# Patient Record
Sex: Female | Born: 1980 | Race: Black or African American | Hispanic: No | Marital: Single | Smoking: Former smoker
Health system: Southern US, Community
[De-identification: ages and names within clinical notes are randomized; demographics above are authoritative.]

## PROBLEM LIST (undated history)

## (undated) DIAGNOSIS — K219 Gastro-esophageal reflux disease without esophagitis: Secondary | ICD-10-CM

## (undated) DIAGNOSIS — I10 Essential (primary) hypertension: Secondary | ICD-10-CM

## (undated) HISTORY — PX: TUBAL LIGATION: SHX77

---

## 2014-06-20 ENCOUNTER — Emergency Department: Payer: Self-pay | Admitting: Emergency Medicine

## 2014-06-20 LAB — BASIC METABOLIC PANEL
ANION GAP: 6 — AB (ref 7–16)
BUN: 17 mg/dL
CALCIUM: 9.7 mg/dL
CHLORIDE: 103 mmol/L
CREATININE: 0.97 mg/dL
Co2: 29 mmol/L
EGFR (African American): >60
GLUCOSE: 117 mg/dL — AB
POTASSIUM: 3.8 mmol/L
Sodium: 138 mmol/L

## 2014-06-20 LAB — CBC
HCT: 45.6 % (ref 35.0–47.0)
HGB: 15 g/dL (ref 12.0–16.0)
MCH: 27.8 pg (ref 26.0–34.0)
MCHC: 32.9 g/dL (ref 32.0–36.0)
MCV: 85 fL (ref 80–100)
PLATELETS: 174 10*3/uL (ref 150–440)
RBC: 5.39 10*6/uL — ABNORMAL HIGH (ref 3.80–5.20)
RDW: 13 % (ref 11.5–14.5)
WBC: 5 10*3/uL (ref 3.6–11.0)

## 2014-06-20 LAB — TROPONIN I

## 2014-09-06 ENCOUNTER — Encounter: Payer: Self-pay | Admitting: Emergency Medicine

## 2014-09-06 ENCOUNTER — Emergency Department
Admission: EM | Admit: 2014-09-06 | Discharge: 2014-09-06 | Disposition: A | Discharge status: Home / Self Care | Payer: Self-pay | Attending: Emergency Medicine | Admitting: Emergency Medicine

## 2014-09-06 DIAGNOSIS — I10 Essential (primary) hypertension: Secondary | ICD-10-CM | POA: Insufficient documentation

## 2014-09-06 DIAGNOSIS — Z720 Tobacco use: Secondary | ICD-10-CM | POA: Insufficient documentation

## 2014-09-06 DIAGNOSIS — H109 Unspecified conjunctivitis: Secondary | ICD-10-CM | POA: Insufficient documentation

## 2014-09-06 HISTORY — DX: Essential (primary) hypertension: I10

## 2014-09-06 MED ORDER — CIPROFLOXACIN HCL 0.3 % OP SOLN: 2.0000 [drp] | OPHTHALMIC | Status: DC

## 2014-09-06 MED ORDER — KETOROLAC TROMETHAMINE 10 MG PO TABS: 10.0000 mg | ORAL_TABLET | Freq: Once | ORAL | Status: DC

## 2014-09-06 MED ORDER — ACETAMINOPHEN 325 MG PO TABS
ORAL_TABLET | ORAL | Status: AC
  Filled 2014-09-06: qty 2

## 2014-09-06 MED ORDER — IBUPROFEN 800 MG PO TABS: 800.0000 mg | ORAL_TABLET | Freq: Three times a day (TID) | ORAL | Status: DC | PRN

## 2014-09-06 MED ORDER — KETOROLAC TROMETHAMINE 10 MG PO TABS
ORAL_TABLET | ORAL | Status: AC
  Filled 2014-09-06: qty 1

## 2014-09-06 MED ORDER — HYDROCHLOROTHIAZIDE 12.5 MG PO CAPS
12.5000 mg | ORAL_CAPSULE | Freq: Every day | ORAL | Status: DC
  Administered 2014-09-06: 12.5 mg via ORAL

## 2014-09-06 MED ORDER — CIPROFLOXACIN HCL 0.3 % OP SOLN
OPHTHALMIC | Status: AC
  Administered 2014-09-06: 2 [drp] via OPHTHALMIC
  Filled 2014-09-06: qty 2.5

## 2014-09-06 MED ORDER — HYDROCHLOROTHIAZIDE 12.5 MG PO CAPS
ORAL_CAPSULE | ORAL | Status: AC
  Administered 2014-09-06: 12.5 mg via ORAL
  Filled 2014-09-06: qty 1

## 2014-09-06 MED ORDER — ACETAMINOPHEN 325 MG PO TABS: 650.0000 mg | ORAL_TABLET | Freq: Once | ORAL | Status: DC

## 2014-09-06 MED ORDER — CIPROFLOXACIN HCL 0.3 % OP SOLN
2.0000 [drp] | Freq: Once | OPHTHALMIC | Status: AC
  Administered 2014-09-06: 2 [drp] via OPHTHALMIC

## 2014-09-06 MED ORDER — HYDROCHLOROTHIAZIDE 12.5 MG PO CAPS: 12.5000 mg | ORAL_CAPSULE | Freq: Every day | ORAL | Status: DC

## 2014-09-06 NOTE — ED Notes (Addendum)
Pt reports a "vail" over eyes. Reports for the past month green discharge has been leaking from eyes. Pt verbalized eyes have felt irritated. Pt wearing contacts at this time and reports no new changes in contacts in the past month. Py verbalized headache at this time, states, "I always get a headache when my blood pressure is high"

## 2014-09-06 NOTE — ED Provider Notes (Signed)
[facility] Emergency Department Provider Note  ____________________________________________  Time seen: 1645  I have reviewed the triage vital signs and the nursing notes.   HISTORY  Chief Complaint Eye Pain    HPI Shirley Stewart is a 34 y.o. female complaining ofgreen discharge coming from her eyes states that off and on over the last month she feels like she has had trouble seeing reports that she has disposable contacts and has not changed them due to the fact that she ran out of the prescription she also states that her blood pressures elevated and has a headache also because she ran out of prescription for blood pressure medication states when she takes her contacts out the blurred vision goes away no other complaints at this time rates her headache as about a 6 out of 10 nothing seemingly making it better or worse and is here today for further evaluation and treatment   Past Medical History  Diagnosis Date  . Hypertension     There are no active problems to display for this patient.   History reviewed. No pertinent past surgical history.  Current Outpatient Rx  Name  Route  Sig  Dispense  Refill  . ciprofloxacin (CILOXAN) 0.3 % ophthalmic solution   Both Eyes   Place 2 drops into both eyes every 4 (four) hours while awake. Administer 1 drop, every 2 hours, while awake, for 2 days. Then 1 drop, every 4 hours, while awake, for the next 5 days.   5 mL   0   . hydrochlorothiazide (MICROZIDE) 12.5 MG capsule   Oral   Take 1 capsule (12.5 mg total) by mouth daily.   30 capsule   0   . ibuprofen (ADVIL,MOTRIN) 800 MG tablet   Oral   Take 1 tablet (800 mg total) by mouth every 8 (eight) hours as needed.   30 tablet   0     Allergies Review of patient's allergies indicates no known allergies.  History reviewed. No pertinent family history.  Social History History  Substance Use Topics  . Smoking status: Current Every Day Smoker  .  Smokeless tobacco: Not on file  . Alcohol Use: No    Review of Systems Constitutional: No fever/chills Eyes: No visual changes. ENT: No sore throat. Cardiovascular: Denies chest pain. Respiratory: Denies shortness of breath. Gastrointestinal: No abdominal pain.  No nausea, no vomiting.  No diarrhea.  No constipation. Genitourinary: Negative for dysuria. Musculoskeletal: Negative for back pain. Skin: Negative for rash. Neurological: Negative for headaches, focal weakness or numbness.  10-point ROS otherwise negative.  ____________________________________________   PHYSICAL EXAM:  VITAL SIGNS: ED Triage Vitals  Enc Vitals Group     BP 09/06/14 1605 151/100 mmHg     Pulse Rate 09/06/14 1605 85     Resp 09/06/14 1605 18     Temp 09/06/14 1605 98.1 F (36.7 C)     Temp Source 09/06/14 1605 Oral     SpO2 09/06/14 1605 100 %     Weight 09/06/14 1605 130 lb (58.968 kg)     Height 09/06/14 1605 5' (1.524 m)     Head Cir --      Peak Flow --      Pain Score 09/06/14 1606 8     Pain Loc --      Pain Edu? --      Excl. in GC? --     Constitutional: Alert and oriented. Well appearing and in no acute distress. Eyes: Red  and injected conjunctiva. PERRL. EOMI. Head: Atraumatic. Nose: No congestion/rhinnorhea. Mouth/Throat: Mucous membranes are moist.  Oropharynx non-erythematous. Neck: No stridor.   Cardiovascular: Normal rate, regular rhythm. Grossly normal heart sounds.  Good peripheral circulation. Respiratory: Normal respiratory effort.  No retractions. Lungs CTAB. Gastrointestinal: Soft and nontender. No distention. No abdominal bruits. No CVA tenderness. Musculoskeletal: No lower extremity tenderness nor edema.  No joint effusions. Neurologic:  Normal speech and language. No gross focal neurologic deficits are appreciated. Speech is normal. No gait instability. Skin:  Skin is warm, dry and intact. No rash noted. Psychiatric: Mood and affect are normal. Speech and behavior  are normal.  ____________________________________________  PROCEDURES  Procedure(s) performed: None  Critical Care performed: No  ____________________________________________   INITIAL IMPRESSION / ASSESSMENT AND PLAN / ED COURSE  Pertinent labs & imaging results that were available during my care of the patient were reviewed by me and considered in my medical decision making (see chart for details).  Initial impression on this patient conjunctivitis patient also had an elevated blood pressure has a history of same has been out of her hydrochlorothiazide for about a week when ahead and gave her a dose here and a been a refill that given that she is a contact wearer started her on Cipro drops advised her to throw out her old contacts that she cannot recycle them ____________________________________________   FINAL CLINICAL IMPRESSION(S) / ED DIAGNOSES  Final diagnoses:  Bilateral conjunctivitis     Paulett Kaufhold Rosalyn Gess, PA-C 09/06/14 1754  Loleta Rose, MD 09/07/14 [phone]

## 2014-09-06 NOTE — ED Notes (Signed)
Pt to ed with c/o bilat eye pain and drainage x 1 month, reports pain when in sunlight.

## 2014-09-06 NOTE — Discharge Instructions (Signed)
Bacterial Conjunctivitis Bacterial conjunctivitis (commonly called pink eye) is redness, soreness, or puffiness (inflammation) of the white part of your eye. It is caused by a germ called bacteria. These germs can easily spread from person to person (contagious). Your eye often will become red or pink. Your eye may also become irritated, watery, or have a thick discharge.  HOME CARE   Apply a cool, clean washcloth over closed eyelids. Do this for 10-20 minutes, 3-4 times a day while you have pain.  Gently wipe away any fluid coming from the eye with a warm, wet washcloth or cotton ball.  Wash your hands often with soap and water. Use paper towels to dry your hands.  Do not share towels or washcloths.  Change or wash your pillowcase every day.  Do not use eye makeup until the infection is gone.  Do not use machines or drive if your vision is blurry.  Stop using contact lenses. Do not use them again until your doctor says it is okay.  Do not touch the tip of the eye drop bottle or medicine tube with your fingers when you put medicine on the eye. GET HELP RIGHT AWAY IF:   Your eye is not better after 3 days of starting your medicine.  You have a yellowish fluid coming out of the eye.  You have more pain in the eye.  Your eye redness is spreading.  Your vision becomes blurry.  You have a fever or lasting symptoms for more than 2-3 days.  You have a fever and your symptoms suddenly get worse.  You have pain in the face.  Your face gets red or puffy (swollen). MAKE SURE YOU:   Understand these instructions.  Will watch this condition.  Will get help right away if you are not doing well or get worse. Document Released: 12/19/2007 Document Revised: 02/26/2012 Document Reviewed: 11/15/2011 [facility] Patient Information 2015 [place], [place]. This information is not intended to replace advice given to you by your health care provider. Make sure you discuss any questions you have  with your health care provider.  Eye Drops Use eye drops as directed. It may be easier to have someone help you put the drops in your eye. If you are alone, use the following instructions to help you.  Wash your hands before putting drops in your eyes.  Read the label and look at your medication. Check for any expiration date that may appear on the bottle or tube. Changes of color may be a warning that the medication is old or ineffective. This is especially true if the medication has become brown in color. If you have questions or concerns, call your caregiver. DROPS  Tilt your head back with the affected eye uppermost. Gently pull down on your lower lid. Do not pull up on the upper lid.  Look up. Place the dropper or bottle just over the edge of the lower lid near the white portion at the bottom of the eye. The goal is to have the drop go into the little sac formed by the lower lid and the bottom of the eye itself. Do not release the drop from a height of several inches over the eye. That will only serve to startle the person receiving the medicine when it lands and forces a blink.  Steady your hand in a comfortable manner. An example would be to hold the dropper or bottle between your thumb and index (pointing) finger. Lean your index finger against the brow.  Then, slowly and gently squeeze one drop of medication into your eye. °· Once the medication has been applied, place your finger between the lower eyelid and the nose, pressing firmly against the nose for 5-10 seconds. This will slow the process of the eye drop entering the small canal that normally drains tears into the nose, and therefore increases the exposure of the medicine to the eye for a few extra seconds. °OINTMENTS °· Look up. Place the tip of the tube just over the edge of the lower lid near the white portion at the bottom of the eye. The goal is to create a line of ointment along the inner surface of the eyelid in the little sac  formed by the lower lid and the bottom of the eye itself. °· Avoid touching the tube tip to your eyeball or eyelid. This avoids contamination of the tube or the medicine in the tube. °· Once a line of medicine has been created, hold the upper lid up and look down before releasing the upper lid. This will force the ointment to spread over the surface of the eye. °· Your vision will be very blurry for a few minutes after applying an ointment properly. This is normal and will clear as you continue to blink. For this reason, it is best to apply ointments just before going to sleep, or at a time when you can rest your eyes for 5-10 minutes after applying the medication. °GENERAL °· Store your medicine in a cool, dry place after each use. °· If you need a second medication, wait at least two minutes. This helps the first medication to be taken up (absorbed) by the eye. °· If you have been instructed to use both an eye drop and an eye ointment, always apply the drop first and then the ointment 3-4 minutes afterward. °Never put medications into the eye unless the label reads, "For Ophthalmic Use," "For Use In Eyes" or "Eye Drops." If you have questions, call your caregiver. °Document Released: 06/17/2000 Document Revised: 07/26/2013 Document Reviewed: 08/23/2008 °[employer]® Patient Information ©2015 [employer]. This information is not intended to replace advice given to you by your health care provider. Make sure you discuss any questions you have with your health care provider. ° °

## 2014-10-25 ENCOUNTER — Other Ambulatory Visit: Payer: Self-pay

## 2014-10-25 ENCOUNTER — Encounter: Payer: Self-pay | Admitting: Emergency Medicine

## 2014-10-25 DIAGNOSIS — I10 Essential (primary) hypertension: Secondary | ICD-10-CM | POA: Insufficient documentation

## 2014-10-25 DIAGNOSIS — Z720 Tobacco use: Secondary | ICD-10-CM | POA: Insufficient documentation

## 2014-10-25 DIAGNOSIS — Z79899 Other long term (current) drug therapy: Secondary | ICD-10-CM | POA: Insufficient documentation

## 2014-10-25 DIAGNOSIS — K219 Gastro-esophageal reflux disease without esophagitis: Secondary | ICD-10-CM | POA: Insufficient documentation

## 2014-10-25 NOTE — ED Notes (Signed)
Pt presents to ED with dizziness and feeling like her blood pressure is elevated. Pt states just prior to arrival she took a sip and water and it felt like something was blocking her throat and she started to feel like she was choking. Pt states her ears were "clogging up" as well. Currently feels like her heart is racing and reports nausea and epigastric pain.

## 2014-10-26 ENCOUNTER — Emergency Department
Admission: EM | Admit: 2014-10-26 | Discharge: 2014-10-26 | Disposition: A | Discharge status: Home / Self Care | Payer: Self-pay | Attending: Emergency Medicine | Admitting: Emergency Medicine

## 2014-10-26 DIAGNOSIS — I10 Essential (primary) hypertension: Secondary | ICD-10-CM

## 2014-10-26 DIAGNOSIS — K219 Gastro-esophageal reflux disease without esophagitis: Secondary | ICD-10-CM

## 2014-10-26 HISTORY — DX: Gastro-esophageal reflux disease without esophagitis: K21.9

## 2014-10-26 LAB — BASIC METABOLIC PANEL
ANION GAP: 7 (ref 5–15)
CALCIUM: 9.2 mg/dL (ref 8.9–10.3)
CHLORIDE: 110 mmol/L (ref 101–111)
CO2: 27 mmol/L (ref 22–32)
Creatinine, Ser: 0.82 mg/dL (ref 0.44–1.00)
GFR calc Af Amer: >60 mL/min (ref >60)
GFR calc non Af Amer: >60 mL/min (ref >60)
GLUCOSE: 94 mg/dL (ref 65–99)
Potassium: 3.3 mmol/L — ABNORMAL LOW (ref 3.5–5.1)
Sodium: 144 mmol/L (ref 135–145)

## 2014-10-26 LAB — CBC
HCT: 37.8 % (ref 35.0–47.0)
Hemoglobin: 12.6 g/dL (ref 12.0–16.0)
MCH: 28 pg (ref 26.0–34.0)
MCHC: 33.3 g/dL (ref 32.0–36.0)
MCV: 84 fL (ref 80.0–100.0)
Platelets: 154 10*3/uL (ref 150–440)
RBC: 4.5 MIL/uL (ref 3.80–5.20)
RDW: 13.4 % (ref 11.5–14.5)
WBC: 6.7 10*3/uL (ref 3.6–11.0)

## 2014-10-26 LAB — TROPONIN I: Troponin I: <0.03 ng/mL (ref <0.031)

## 2014-10-26 MED ORDER — HYDROCHLOROTHIAZIDE 25 MG PO TABS
ORAL_TABLET | ORAL | Status: AC
Start: 2014-10-26 — End: 2014-10-26
  Administered 2014-10-26: 25 mg via ORAL
  Filled 2014-10-26: qty 1

## 2014-10-26 MED ORDER — PANTOPRAZOLE SODIUM 40 MG PO TBEC: 40.0000 mg | DELAYED_RELEASE_TABLET | Freq: Every day | ORAL | Status: DC

## 2014-10-26 MED ORDER — GI COCKTAIL ~~LOC~~: 30.0000 mL | Freq: Once | ORAL | Status: DC

## 2014-10-26 MED ORDER — HYDROCHLOROTHIAZIDE 25 MG PO TABS
25.0000 mg | ORAL_TABLET | Freq: Every day | ORAL | Status: DC
  Administered 2014-10-26: 25 mg via ORAL

## 2014-10-26 MED ORDER — PANTOPRAZOLE SODIUM 40 MG PO TBEC
40.0000 mg | DELAYED_RELEASE_TABLET | Freq: Once | ORAL | Status: AC
  Administered 2014-10-26: 40 mg via ORAL
  Filled 2014-10-26: qty 1

## 2014-10-26 MED ORDER — HYDROCHLOROTHIAZIDE 25 MG PO TABS: 25.0000 mg | ORAL_TABLET | Freq: Every day | ORAL | Status: DC

## 2014-10-26 NOTE — Discharge Instructions (Signed)

## 2014-10-26 NOTE — ED Provider Notes (Signed)
[facility] Emergency Department Provider Note  ____________________________________________  Time seen: 1:00 AM  I have reviewed the triage vital signs and the nursing notes.   HISTORY  Chief Complaint Hypertension and Dizziness      HPI Shirley Stewart is a 34 y.o. female presents with this feeling as though her blood pressures elevated. Patient states she attempted to take a sip of water after eating mashed potatoes and felt as though she was choking patient also states is that she feels like her heart was racing. Patient also admits to epigastric abdominal pain. Patient states that she was prescribed HCTZ for many years but had an episode where her blood pressure went low resulting in her becoming dizzy and a such a primary care doctor discontinued it.     Past Medical History  Diagnosis Date  . Hypertension   . GERD (gastroesophageal reflux disease)     There are no active problems to display for this patient.   Past Surgical History  Procedure Laterality Date  . Tubal ligation      Current Outpatient Rx  Name  Route  Sig  Dispense  Refill  . ciprofloxacin (CILOXAN) 0.3 % ophthalmic solution   Both Eyes   Place 2 drops into both eyes every 4 (four) hours while awake. Administer 1 drop, every 2 hours, while awake, for 2 days. Then 1 drop, every 4 hours, while awake, for the next 5 days.   5 mL   0   . hydrochlorothiazide (HYDRODIURIL) 25 MG tablet   Oral   Take 1 tablet (25 mg total) by mouth daily.   30 tablet   0   . hydrochlorothiazide (MICROZIDE) 12.5 MG capsule   Oral   Take 1 capsule (12.5 mg total) by mouth daily.   30 capsule   0   . ibuprofen (ADVIL,MOTRIN) 800 MG tablet   Oral   Take 1 tablet (800 mg total) by mouth every 8 (eight) hours as needed.   30 tablet   0   . pantoprazole (PROTONIX) 40 MG tablet   Oral   Take 1 tablet (40 mg total) by mouth daily.   30 tablet   0     Allergies Review of patient's  allergies indicates no known allergies.  No family history on file.  Social History History  Substance Use Topics  . Smoking status: Current Every Day Smoker -- 1.00 packs/day    Types: Cigarettes  . Smokeless tobacco: Not on file  . Alcohol Use: No    Review of Systems  Constitutional: Negative for fever. Eyes: Negative for visual changes. ENT: Negative for sore throat. Cardiovascular: Negative for chest pain. Respiratory: Negative for shortness of breath. Gastrointestinal: Negative for abdominal pain, vomiting and diarrhea. Genitourinary: Negative for dysuria. Musculoskeletal: Negative for back pain. Skin: Negative for rash. Neurological: Negative for headaches, focal weakness or numbness.   10-point ROS otherwise negative.  ____________________________________________   PHYSICAL EXAM:  VITAL SIGNS: ED Triage Vitals  Enc Vitals Group     BP 10/25/14 2337 160/108 mmHg     Pulse Rate 10/25/14 2337 97     Resp 10/25/14 2337 18     Temp 10/25/14 2337 98.6 F (37 C)     Temp Source 10/25/14 2337 Oral     SpO2 10/25/14 2337 100 %     Weight 10/25/14 2337 120 lb (54.432 kg)     Height 10/25/14 2337 5' (1.524 m)     Head Cir --  Peak Flow --      Pain Score 10/25/14 2338 9     Pain Loc --      Pain Edu? --      Excl. in GC? --      Constitutional: Alert and oriented. Well appearing and in no distress. Eyes: Conjunctivae are normal. PERRL. Normal extraocular movements. ENT   Head: Normocephalic and atraumatic.   Nose: No congestion/rhinnorhea.   Mouth/Throat: Mucous membranes are moist.   Neck: No stridor. Hematological/Lymphatic/Immunilogical: No cervical lymphadenopathy. Cardiovascular: Normal rate, regular rhythm. Normal and symmetric distal pulses are present in all extremities. No murmurs, rubs, or gallops. Respiratory: Normal respiratory effort without tachypnea nor retractions. Breath sounds are clear and equal bilaterally. No  wheezes/rales/rhonchi. Gastrointestinal: Soft and nontender. No distention. There is no CVA tenderness. Genitourinary: deferred Musculoskeletal: Nontender with normal range of motion in all extremities. No joint effusions.  No lower extremity tenderness nor edema. Neurologic:  Normal speech and language. No gross focal neurologic deficits are appreciated. Speech is normal.  Skin:  Skin is warm, dry and intact. No rash noted. Psychiatric: Mood and affect are normal. Speech and behavior are normal. Patient exhibits appropriate insight and judgment.  ____________________________________________    LABS (pertinent positives/negatives)  Labs Reviewed  BASIC METABOLIC PANEL - Abnormal; Notable for the following:    Potassium 3.3 (*)    BUN <5 (*)    All other components within normal limits  CBC  TROPONIN I  URINALYSIS COMPLETEWITH MICROSCOPIC ([facility] ONLY)  CBG MONITORING, ED     ____________________________________________   EKG  ED ECG REPORT I, BROWN,  N, the attending physician, personally viewed and interpreted this ECG.   Date: 10/26/2014  EKG Time:   Rate:   Rhythm:   Axis:   Intervals:  ST&T Change:    ____________________________________________    RADIOLOGY    INITIAL IMPRESSION / ASSESSMENT AND PLAN / ED COURSE  Pertinent labs & imaging results that were available during my care of the patient were reviewed by me and considered in my medical decision making (see chart for details).    ____________________________________________   FINAL CLINICAL IMPRESSION(S) / ED DIAGNOSES  Final diagnoses:  Essential hypertension  Gastroesophageal reflux disease without esophagitis      Darci Current, MD 10/27/14 [phone]

## 2015-01-26 ENCOUNTER — Emergency Department
Admission: EM | Admit: 2015-01-26 | Discharge: 2015-01-26 | Disposition: A | Discharge status: Home / Self Care | Payer: Self-pay | Attending: Emergency Medicine | Admitting: Emergency Medicine

## 2015-01-26 ENCOUNTER — Encounter: Payer: Self-pay | Admitting: *Deleted

## 2015-01-26 DIAGNOSIS — Z720 Tobacco use: Secondary | ICD-10-CM | POA: Insufficient documentation

## 2015-01-26 DIAGNOSIS — J0100 Acute maxillary sinusitis, unspecified: Secondary | ICD-10-CM | POA: Insufficient documentation

## 2015-01-26 DIAGNOSIS — I10 Essential (primary) hypertension: Secondary | ICD-10-CM | POA: Insufficient documentation

## 2015-01-26 DIAGNOSIS — J029 Acute pharyngitis, unspecified: Secondary | ICD-10-CM | POA: Insufficient documentation

## 2015-01-26 DIAGNOSIS — Z79899 Other long term (current) drug therapy: Secondary | ICD-10-CM | POA: Insufficient documentation

## 2015-01-26 MED ORDER — FLUTICASONE PROPIONATE 50 MCG/ACT NA SUSP: 1.0000 | Freq: Two times a day (BID) | NASAL | Status: DC

## 2015-01-26 MED ORDER — CETIRIZINE HCL 10 MG PO TABS
10.0000 mg | ORAL_TABLET | Freq: Every day | ORAL | Status: DC
Start: 2015-01-26 — End: 2016-10-02

## 2015-01-26 MED ORDER — AMOXICILLIN-POT CLAVULANATE 875-125 MG PO TABS: 1.0000 | ORAL_TABLET | Freq: Two times a day (BID) | ORAL | Status: DC

## 2015-01-26 MED ORDER — MAGIC MOUTHWASH W/LIDOCAINE: 5.0000 mL | Freq: Four times a day (QID) | ORAL | Status: DC

## 2015-01-26 NOTE — ED Provider Notes (Signed)
[facility] Emergency Department Provider Note  ____________________________________________  Time seen: Approximately 10:22 AM  I have reviewed the triage vital signs and the nursing notes.   HISTORY  Chief Complaint Facial Pain    HPI Shirley Stewart is a 34 y.o. female who presents to the emergency department complaining of sinus pressure, congestion, sore throat, and cough. She states that symptoms began rapidly 3 days ago and have progressed since then. She has taken "a few" over-the-counter medicationswith minimal relief. She states the pain is best described as pressure. It's mostly located around her nose and cheek bones.   Past Medical History  Diagnosis Date  . Hypertension   . GERD (gastroesophageal reflux disease)     There are no active problems to display for this patient.   Past Surgical History  Procedure Laterality Date  . Tubal ligation      Current Outpatient Rx  Name  Route  Sig  Dispense  Refill  . amoxicillin-clavulanate (AUGMENTIN) 875-125 MG tablet   Oral   Take 1 tablet by mouth 2 (two) times daily.   14 tablet   0   . cetirizine (ZYRTEC) 10 MG tablet   Oral   Take 1 tablet (10 mg total) by mouth daily.   30 tablet   0   . ciprofloxacin (CILOXAN) 0.3 % ophthalmic solution   Both Eyes   Place 2 drops into both eyes every 4 (four) hours while awake. Administer 1 drop, every 2 hours, while awake, for 2 days. Then 1 drop, every 4 hours, while awake, for the next 5 days.   5 mL   0   . fluticasone (FLONASE) 50 MCG/ACT nasal spray   Each Nare   Place 1 spray into both nostrils 2 (two) times daily.   16 g   0   . hydrochlorothiazide (HYDRODIURIL) 25 MG tablet   Oral   Take 1 tablet (25 mg total) by mouth daily.   30 tablet   0   . hydrochlorothiazide (MICROZIDE) 12.5 MG capsule   Oral   Take 1 capsule (12.5 mg total) by mouth daily.   30 capsule   0   . ibuprofen (ADVIL,MOTRIN) 800 MG tablet   Oral  Take 1 tablet (800 mg total) by mouth every 8 (eight) hours as needed.   30 tablet   0   . magic mouthwash w/lidocaine SOLN   Oral   Take 5 mLs by mouth 4 (four) times daily.   240 mL   0     Dispense in a 1/1/1/1 ratio   . pantoprazole (PROTONIX) 40 MG tablet   Oral   Take 1 tablet (40 mg total) by mouth daily.   30 tablet   0     Allergies Review of patient's allergies indicates no known allergies.  No family history on file.  Social History Social History  Substance Use Topics  . Smoking status: Current Every Day Smoker -- 1.00 packs/day    Types: Cigarettes  . Smokeless tobacco: None  . Alcohol Use: No    Review of Systems Constitutional: No fever/chills Eyes: No visual changes. ENT: Endorses nasal congestion. Endorses sinus pressure. Endorses sore throat.. Cardiovascular: Denies chest pain.  Respiratory: Denies shortness of breath. Endorses cough. Gastrointestinal: No abdominal pain.  No nausea, no vomiting.  No diarrhea.  No constipation. Genitourinary: Negative for dysuria. Musculoskeletal: Negative for back pain. Skin: Negative for rash. Neurological: Negative for headaches, focal weakness or numbness.  10-point ROS otherwise negative.  ____________________________________________   PHYSICAL EXAM:  VITAL SIGNS: ED Triage Vitals  Enc Vitals Group     BP 01/26/15 1008 145/109 mmHg     Pulse Rate 01/26/15 1008 80     Resp 01/26/15 1008 20     Temp 01/26/15 1008 98.2 F (36.8 C)     Temp Source 01/26/15 1008 Oral     SpO2 01/26/15 1008 100 %     Weight 01/26/15 1008 127 lb (57.607 kg)     Height 01/26/15 1008 5' (1.524 m)     Head Cir --      Peak Flow --      Pain Score 01/26/15 1009 8     Pain Loc --      Pain Edu? --      Excl. in GC? --     Constitutional: Alert and oriented. Well appearing and in no acute distress. Eyes: Conjunctivae are normal. PERRL. EOMI. Head: Atraumatic. Nose: Moderate purulent congestion/rhinnorhea. Turbinates  are erythematous and edematous. Maxillary sinuses are tender to percussion bilaterally. Mouth/Throat: Mucous membranes are moist.  Oropharynx non-erythematous. Neck: No stridor.   Hematological/Lymphatic/Immunilogical: Diffuse, nontender, mobile anterior cervical lymphadenopathy. Cardiovascular: Normal rate, regular rhythm. Grossly normal heart sounds.  Good peripheral circulation. Respiratory: Normal respiratory effort.  No retractions. Lungs CTAB. Gastrointestinal: Soft and nontender. No distention. No abdominal bruits. No CVA tenderness. Musculoskeletal: No lower extremity tenderness nor edema.  No joint effusions. Neurologic:  Normal speech and language. No gross focal neurologic deficits are appreciated. No gait instability. Skin:  Skin is warm, dry and intact. No rash noted. Psychiatric: Mood and affect are normal. Speech and behavior are normal.  ____________________________________________   LABS (all labs ordered are listed, but only abnormal results are displayed)  Labs Reviewed - No data to display ____________________________________________  EKG   ____________________________________________  RADIOLOGY   ____________________________________________   PROCEDURES  Procedure(s) performed: None  Critical Care performed: No  ____________________________________________   INITIAL IMPRESSION / ASSESSMENT AND PLAN / ED COURSE  Pertinent labs & imaging results that were available during my care of the patient were reviewed by me and considered in my medical decision making (see chart for details).  Patient's history, symptoms, physical exam are consistent with the finding of bacterial sinusitis. I'll place the patient on Augmentin. Flonase and Zyrtec, and magic mouthwash for symptomatically for the sore throat. I advised patient of findings and diagnosis and she verbalizes understanding. Patient also verbalizes compliance with treatment plan. She is to follow-up with  primary care provider for symptoms that is not resolved with treatment course. ____________________________________________   FINAL CLINICAL IMPRESSION(S) / ED DIAGNOSES  Final diagnoses:  Acute maxillary sinusitis, recurrence not specified      Racheal Patches, PA-C 01/26/15 1035  Jene Every, MD 01/26/15 [phone]

## 2015-01-26 NOTE — Discharge Instructions (Signed)

## 2015-01-26 NOTE — ED Notes (Signed)
Sinus pain, runny nose, chest congestion

## 2015-04-05 ENCOUNTER — Other Ambulatory Visit: Payer: Self-pay

## 2015-04-05 ENCOUNTER — Emergency Department: Payer: Self-pay

## 2015-04-05 ENCOUNTER — Encounter: Payer: Self-pay | Admitting: Urgent Care

## 2015-04-05 DIAGNOSIS — R110 Nausea: Secondary | ICD-10-CM | POA: Insufficient documentation

## 2015-04-05 DIAGNOSIS — R079 Chest pain, unspecified: Secondary | ICD-10-CM | POA: Insufficient documentation

## 2015-04-05 DIAGNOSIS — F17210 Nicotine dependence, cigarettes, uncomplicated: Secondary | ICD-10-CM | POA: Insufficient documentation

## 2015-04-05 DIAGNOSIS — I10 Essential (primary) hypertension: Secondary | ICD-10-CM | POA: Insufficient documentation

## 2015-04-05 DIAGNOSIS — R61 Generalized hyperhidrosis: Secondary | ICD-10-CM | POA: Insufficient documentation

## 2015-04-05 LAB — BASIC METABOLIC PANEL
Anion gap: 4 — ABNORMAL LOW (ref 5–15)
BUN: 6 mg/dL (ref 6–20)
CO2: 24 mmol/L (ref 22–32)
CREATININE: 0.83 mg/dL (ref 0.44–1.00)
Calcium: 8.5 mg/dL — ABNORMAL LOW (ref 8.9–10.3)
Chloride: 110 mmol/L (ref 101–111)
GFR calc Af Amer: >60 mL/min (ref >60)
GFR calc non Af Amer: >60 mL/min (ref >60)
Glucose, Bld: 100 mg/dL — ABNORMAL HIGH (ref 65–99)
Potassium: 3.6 mmol/L (ref 3.5–5.1)
SODIUM: 138 mmol/L (ref 135–145)

## 2015-04-05 LAB — CBC
HCT: 36.1 % (ref 35.0–47.0)
Hemoglobin: 11.9 g/dL — ABNORMAL LOW (ref 12.0–16.0)
MCH: 27 pg (ref 26.0–34.0)
MCHC: 32.9 g/dL (ref 32.0–36.0)
MCV: 82.1 fL (ref 80.0–100.0)
PLATELETS: 177 10*3/uL (ref 150–440)
RBC: 4.39 MIL/uL (ref 3.80–5.20)
RDW: 14.4 % (ref 11.5–14.5)
WBC: 6.4 10*3/uL (ref 3.6–11.0)

## 2015-04-05 LAB — TROPONIN I: Troponin I: <0.03 ng/mL (ref <0.031)

## 2015-04-05 NOTE — ED Notes (Addendum)
Patient presents with c/o upper chest pain that began 30 mins ago. Pain initially radiated into BUE, however now it only radiates into RUE and RIGHT side of neck. (+) nausea and slight diaphoresis reported. Patient advising that pain started while carrying her groceries into the house. Called EMS, however ultimately decided to present tonight to ED via POV.

## 2015-04-06 ENCOUNTER — Emergency Department
Admission: EM | Admit: 2015-04-06 | Discharge: 2015-04-06 | Discharge status: Against Medical Advice | Payer: Self-pay | Attending: Emergency Medicine | Admitting: Emergency Medicine

## 2015-07-23 ENCOUNTER — Emergency Department: Payer: Medicaid Other

## 2015-07-23 ENCOUNTER — Encounter: Payer: Self-pay | Admitting: Emergency Medicine

## 2015-07-23 ENCOUNTER — Emergency Department
Admission: EM | Admit: 2015-07-23 | Discharge: 2015-07-23 | Disposition: A | Discharge status: Home / Self Care | Payer: Medicaid Other | Attending: Emergency Medicine | Admitting: Emergency Medicine

## 2015-07-23 DIAGNOSIS — Z79899 Other long term (current) drug therapy: Secondary | ICD-10-CM | POA: Insufficient documentation

## 2015-07-23 DIAGNOSIS — F17210 Nicotine dependence, cigarettes, uncomplicated: Secondary | ICD-10-CM | POA: Insufficient documentation

## 2015-07-23 DIAGNOSIS — Z792 Long term (current) use of antibiotics: Secondary | ICD-10-CM | POA: Insufficient documentation

## 2015-07-23 DIAGNOSIS — I10 Essential (primary) hypertension: Secondary | ICD-10-CM | POA: Insufficient documentation

## 2015-07-23 DIAGNOSIS — R0789 Other chest pain: Secondary | ICD-10-CM | POA: Insufficient documentation

## 2015-07-23 DIAGNOSIS — Z7951 Long term (current) use of inhaled steroids: Secondary | ICD-10-CM | POA: Diagnosis not present

## 2015-07-23 DIAGNOSIS — R079 Chest pain, unspecified: Secondary | ICD-10-CM

## 2015-07-23 LAB — COMPREHENSIVE METABOLIC PANEL
ALT: 13 U/L — ABNORMAL LOW (ref 14–54)
AST: 17 U/L (ref 15–41)
Albumin: 4.3 g/dL (ref 3.5–5.0)
Alkaline Phosphatase: 52 U/L (ref 38–126)
Anion gap: 8 (ref 5–15)
BUN: 9 mg/dL (ref 6–20)
CALCIUM: 8.9 mg/dL (ref 8.9–10.3)
CHLORIDE: 107 mmol/L (ref 101–111)
CO2: 25 mmol/L (ref 22–32)
Creatinine, Ser: 0.99 mg/dL (ref 0.44–1.00)
GFR calc Af Amer: >60 mL/min (ref >60)
Glucose, Bld: 95 mg/dL (ref 65–99)
POTASSIUM: 3.5 mmol/L (ref 3.5–5.1)
Sodium: 140 mmol/L (ref 135–145)
TOTAL PROTEIN: 7.2 g/dL (ref 6.5–8.1)
Total Bilirubin: 0.6 mg/dL (ref 0.3–1.2)

## 2015-07-23 LAB — CBC
HCT: 37.2 % (ref 35.0–47.0)
Hemoglobin: 12.2 g/dL (ref 12.0–16.0)
MCH: 26.9 pg (ref 26.0–34.0)
MCHC: 32.8 g/dL (ref 32.0–36.0)
MCV: 81.9 fL (ref 80.0–100.0)
PLATELETS: 167 10*3/uL (ref 150–440)
RBC: 4.54 MIL/uL (ref 3.80–5.20)
RDW: 13.5 % (ref 11.5–14.5)
WBC: 8.6 10*3/uL (ref 3.6–11.0)

## 2015-07-23 LAB — TROPONIN I: Troponin I: <0.03 ng/mL (ref <0.031)

## 2015-07-23 MED ORDER — IBUPROFEN 600 MG PO TABS: 600.0000 mg | ORAL_TABLET | Freq: Three times a day (TID) | ORAL | Status: DC | PRN

## 2015-07-23 NOTE — Discharge Instructions (Signed)
You were evaluated for chest discomfort, and although no certain cause was found, as we discussed I suspect chest wall pain or costochondritis.  Because of some EKG findings as well as risk factors including smoking for cardiac disease, I am recommending follow-up with a cardiologist, call in the morning for an appointment next 1-2 days.  Return to the emergency department for any new or worsening condition including worsening chest pain, trouble breathing, shortness of breath, sweating, nausea/vomiting, fever, or any other symptoms concerning to you.   Costochondritis Costochondritis, sometimes called Tietze syndrome, is a swelling and irritation (inflammation) of the tissue (cartilage) that connects your ribs with your breastbone (sternum). It causes pain in the chest and rib area. Costochondritis usually goes away on its own over time. It can take up to 6 weeks or longer to get better, especially if you are unable to limit your activities. CAUSES  Some cases of costochondritis have no known cause. Possible causes include:  Injury (trauma).  Exercise or activity such as lifting.  Severe coughing. SIGNS AND SYMPTOMS  Pain and tenderness in the chest and rib area.  Pain that gets worse when coughing or taking deep breaths.  Pain that gets worse with specific movements. DIAGNOSIS  Your health care provider will do a physical exam and ask about your symptoms. Chest X-rays or other tests may be done to rule out other problems. TREATMENT  Costochondritis usually goes away on its own over time. Your health care provider may prescribe medicine to help relieve pain. HOME CARE INSTRUCTIONS   Avoid exhausting physical activity. Try not to strain your ribs during normal activity. This would include any activities using chest, abdominal, and side muscles, especially if heavy weights are used.  Apply ice to the affected area for the first 2 days after the pain begins.  Put ice in a plastic  bag.  Place a towel between your skin and the bag.  Leave the ice on for 20 minutes, 2-3 times a day.  Only take over-the-counter or prescription medicines as directed by your health care provider. SEEK MEDICAL CARE IF:  You have redness or swelling at the rib joints. These are signs of infection.  Your pain does not go away despite rest or medicine. SEEK IMMEDIATE MEDICAL CARE IF:   Your pain increases or you are very uncomfortable.  You have shortness of breath or difficulty breathing.  You cough up blood.  You have worse chest pains, sweating, or vomiting.  You have a fever or persistent symptoms for more than 2-3 days.  You have a fever and your symptoms suddenly get worse. MAKE SURE YOU:   Understand these instructions.  Will watch your condition.  Will get help right away if you are not doing well or get worse.   This information is not intended to replace advice given to you by your health care provider. Make sure you discuss any questions you have with your health care provider.   Document Released: 12/19/2004 Document Revised: 12/30/2012 Document Reviewed: 10/13/2012 Elsevier Interactive Patient Education 2016 Elsevier Inc.   Chest Wall Pain Chest wall pain is pain in or around the bones and muscles of your chest. Sometimes, an injury causes this pain. Sometimes, the cause may not be known. This pain may take several weeks or longer to get better. HOME CARE INSTRUCTIONS  Pay attention to any changes in your symptoms. Take these actions to help with your pain:   Rest as told by your health care provider.  Avoid activities that cause pain. These include any activities that use your chest muscles or your abdominal and side muscles to lift heavy items.   If directed, apply ice to the painful area:  Put ice in a plastic bag.  Place a towel between your skin and the bag.  Leave the ice on for 20 minutes, 2-3 times per day.  Take over-the-counter and  prescription medicines only as told by your health care provider.  Do not use tobacco products, including cigarettes, chewing tobacco, and e-cigarettes. If you need help quitting, ask your health care provider.  Keep all follow-up visits as told by your health care provider. This is important. SEEK MEDICAL CARE IF:  You have a fever.  Your chest pain becomes worse.  You have new symptoms. SEEK IMMEDIATE MEDICAL CARE IF:  You have nausea or vomiting.  You feel sweaty or light-headed.  You have a cough with phlegm (sputum) or you cough up blood.  You develop shortness of breath.   This information is not intended to replace advice given to you by your health care provider. Make sure you discuss any questions you have with your health care provider.   Document Released: 03/11/2005 Document Revised: 11/30/2014 Document Reviewed: 06/06/2014 Elsevier Interactive Patient Education [employer] Elsevier Inc.

## 2015-07-23 NOTE — ED Notes (Addendum)
Patient arrives to [facility] ED via POV with c/o chest pain. Patient woke from sleep two days ago with midsternal non radiating chest pain without further symptoms. Patient states her chest is tender to palpation. Denies trauma  Addendum: patient is tender to palpation BUQ

## 2015-07-23 NOTE — ED Provider Notes (Signed)
[facility] Emergency Department Provider Note   ____________________________________________  Time seen: Approximately 9:30pm I have reviewed the triage vital signs and the triage nursing note.  HISTORY  Chief Complaint Chest Pain   Historian Patient  HPI Shirley Stewart is a 35 y.o. female who is a smoker, 2 different hypertension, who is here with central and right-sided chest discomfort across her breast bone for several days now. Constant mildly waxing and waning feels like it is sharp poke, and is worse with palpation. No trouble breathing or pleuritic chest pain. No fever. No coughing. No vomiting. Denies stress or anxiety. Denies GERD like symptoms are epigastric pain.  Symptoms are mild to moderate.    Past Medical History  Diagnosis Date  . Hypertension   . GERD (gastroesophageal reflux disease)     There are no active problems to display for this patient.   Past Surgical History  Procedure Laterality Date  . Tubal ligation      Current Outpatient Rx  Name  Route  Sig  Dispense  Refill  . amoxicillin-clavulanate (AUGMENTIN) 875-125 MG tablet   Oral   Take 1 tablet by mouth 2 (two) times daily.   14 tablet   0   . cetirizine (ZYRTEC) 10 MG tablet   Oral   Take 1 tablet (10 mg total) by mouth daily.   30 tablet   0   . ciprofloxacin (CILOXAN) 0.3 % ophthalmic solution   Both Eyes   Place 2 drops into both eyes every 4 (four) hours while awake. Administer 1 drop, every 2 hours, while awake, for 2 days. Then 1 drop, every 4 hours, while awake, for the next 5 days.   5 mL   0   . fluticasone (FLONASE) 50 MCG/ACT nasal spray   Each Nare   Place 1 spray into both nostrils 2 (two) times daily.   16 g   0   . hydrochlorothiazide (HYDRODIURIL) 25 MG tablet   Oral   Take 1 tablet (25 mg total) by mouth daily.   30 tablet   0   . hydrochlorothiazide (MICROZIDE) 12.5 MG capsule   Oral   Take 1 capsule (12.5 mg total) by mouth  daily.   30 capsule   0   . ibuprofen (ADVIL,MOTRIN) 600 MG tablet   Oral   Take 1 tablet (600 mg total) by mouth every 8 (eight) hours as needed.   20 tablet   0   . magic mouthwash w/lidocaine SOLN   Oral   Take 5 mLs by mouth 4 (four) times daily.   240 mL   0     Dispense in a 1/1/1/1 ratio   . pantoprazole (PROTONIX) 40 MG tablet   Oral   Take 1 tablet (40 mg total) by mouth daily.   30 tablet   0     Allergies Review of patient's allergies indicates no known allergies.  History reviewed. No pertinent family history.  Social History Social History  Substance Use Topics  . Smoking status: Current Every Day Smoker -- 1.00 packs/day    Types: Cigarettes  . Smokeless tobacco: None  . Alcohol Use: No    Review of Systems  Constitutional: Negative for fever. Eyes: Negative for visual changes. ENT: Negative for sore throat. Cardiovascular: Negative for Palpitations. Respiratory: Negative for shortness of breath. Gastrointestinal: Negative for abdominal pain, vomiting and diarrhea. Genitourinary: Negative for dysuria. Musculoskeletal: Negative for back pain. Skin: Negative for rash. Neurological: Negative for headache. 10  point Review of Systems otherwise negative ____________________________________________   PHYSICAL EXAM:  VITAL SIGNS: ED Triage Vitals  Enc Vitals Group     BP 07/23/15 1849 136/87 mmHg     Pulse Rate 07/23/15 1849 106     Resp 07/23/15 1849 18     Temp 07/23/15 1849 98.3 F (36.8 C)     Temp Source 07/23/15 1849 Oral     SpO2 07/23/15 1849 100 %     Weight 07/23/15 1849 140 lb (63.504 kg)     Height 07/23/15 1849 5' (1.524 m)     Head Cir --      Peak Flow --      Pain Score 07/23/15 1850 8     Pain Loc --      Pain Edu? --      Excl. in GC? --      Constitutional: Alert and oriented. Well appearing and in no distress. HEENT   Head: Normocephalic and atraumatic.      Eyes: Conjunctivae are normal. PERRL. Normal  extraocular movements.      Ears:         Nose: No congestion/rhinnorhea.   Mouth/Throat: Mucous membranes are moist.   Neck: No stridor. Cardiovascular/Chest: Normal rate, regular rhythm.  No murmurs, rubs, or gallops. Respiratory: Normal respiratory effort without tachypnea nor retractions. Breath sounds are clear and equal bilaterally. No wheezes/rales/rhonchi. Gastrointestinal: Soft. No distention, no guarding, no rebound. Nontender.    Genitourinary/rectal:Deferred Musculoskeletal: Nontender with normal range of motion in all extremities. No joint effusions.  No lower extremity tenderness.  No edema. Neurologic:  Normal speech and language. No gross or focal neurologic deficits are appreciated. Skin:  Skin is warm, dry and intact. No rash noted. Psychiatric: Mood and affect are normal. Speech and behavior are normal. Patient exhibits appropriate insight and judgment.  ____________________________________________   EKG I, Governor Rooksebecca Shadee Rathod, MD, the attending physician have personally viewed and interpreted all ECGs.  112 bpm. Sinus tachycardia. Narrow QRS. Normal axis. Nonspecific T-wave  Compared with prior EKG, nonspecific T-wave findings are new ____________________________________________  LABS (pertinent positives/negatives)  White blood count 8.6, hemoglobin 12.2 and platelet count 167 Comprehensive metabolic panel without significant abnormality Troponin less than 0.03  ____________________________________________  RADIOLOGY All Xrays were viewed by me. Imaging interpreted by Radiologist.  Chest two-view: No active cardio pulmonary disease __________________________________________  PROCEDURES  Procedure(s) performed: None  Critical Care performed: None  ____________________________________________   ED COURSE / ASSESSMENT AND PLAN  Pertinent labs & imaging results that were available during my care of the patient were reviewed by me and considered in my  medical decision making (see chart for details).   Miss Shirley Stewart is here for chest discomfort which sounds like costochondritis clinically on exam. She has point tenderness over the right side of the sternum. No known overuse or traumatic injury. No respiratory symptoms. Clinically I don't have suspicion for pulmonary embolus. Her O2 sat is 100%.  She states her symptoms are not GERD-like although she does have a history GERD.  I am going to go ahead and refer her for cardiology follow-up given a risk factors of hypertension and smoking, and some EKG nonspecific findings which are different from prior EKG in the past.     CONSULTATIONS:   None   Patient / Family / Caregiver informed of clinical course, medical decision-making process, and agree with plan.   I discussed return precautions, follow-up instructions, and discharged instructions with patient and/or family.   ___________________________________________  FINAL CLINICAL IMPRESSION(S) / ED DIAGNOSES   Final diagnoses:  Chest pain, unspecified              Note: This dictation was prepared with Dragon dictation. Any transcriptional errors that result from this process are unintentional   Governor Rooks, MD 07/23/15 2148

## 2015-08-03 ENCOUNTER — Ambulatory Visit: Payer: Medicaid Other | Admitting: Cardiology

## 2015-09-27 ENCOUNTER — Emergency Department
Admission: EM | Admit: 2015-09-27 | Discharge: 2015-09-27 | Disposition: A | Discharge status: Home / Self Care | Payer: Medicaid Other | Attending: Emergency Medicine | Admitting: Emergency Medicine

## 2015-09-27 ENCOUNTER — Emergency Department: Payer: Medicaid Other

## 2015-09-27 ENCOUNTER — Encounter: Payer: Self-pay | Admitting: Emergency Medicine

## 2015-09-27 DIAGNOSIS — R0789 Other chest pain: Secondary | ICD-10-CM | POA: Insufficient documentation

## 2015-09-27 DIAGNOSIS — K2970 Gastritis, unspecified, without bleeding: Secondary | ICD-10-CM | POA: Insufficient documentation

## 2015-09-27 DIAGNOSIS — Z79899 Other long term (current) drug therapy: Secondary | ICD-10-CM | POA: Insufficient documentation

## 2015-09-27 DIAGNOSIS — R079 Chest pain, unspecified: Secondary | ICD-10-CM

## 2015-09-27 DIAGNOSIS — Z7951 Long term (current) use of inhaled steroids: Secondary | ICD-10-CM | POA: Insufficient documentation

## 2015-09-27 DIAGNOSIS — Z87891 Personal history of nicotine dependence: Secondary | ICD-10-CM | POA: Diagnosis not present

## 2015-09-27 DIAGNOSIS — Z792 Long term (current) use of antibiotics: Secondary | ICD-10-CM | POA: Diagnosis not present

## 2015-09-27 DIAGNOSIS — I10 Essential (primary) hypertension: Secondary | ICD-10-CM | POA: Insufficient documentation

## 2015-09-27 DIAGNOSIS — R51 Headache: Secondary | ICD-10-CM | POA: Diagnosis present

## 2015-09-27 LAB — HEPATIC FUNCTION PANEL
ALK PHOS: 57 U/L (ref 38–126)
ALT: 10 U/L — ABNORMAL LOW (ref 14–54)
AST: 16 U/L (ref 15–41)
Albumin: 4 g/dL (ref 3.5–5.0)
BILIRUBIN TOTAL: 0.4 mg/dL (ref 0.3–1.2)
Bilirubin, Direct: <0.1 mg/dL — ABNORMAL LOW (ref 0.1–0.5)
Total Protein: 6.8 g/dL (ref 6.5–8.1)

## 2015-09-27 LAB — BASIC METABOLIC PANEL
ANION GAP: 5 (ref 5–15)
BUN: 7 mg/dL (ref 6–20)
CALCIUM: 8.7 mg/dL — AB (ref 8.9–10.3)
CHLORIDE: 105 mmol/L (ref 101–111)
CO2: 29 mmol/L (ref 22–32)
Creatinine, Ser: 1.03 mg/dL — ABNORMAL HIGH (ref 0.44–1.00)
GFR calc non Af Amer: >60 mL/min (ref >60)
Glucose, Bld: 99 mg/dL (ref 65–99)
POTASSIUM: 3.6 mmol/L (ref 3.5–5.1)
SODIUM: 139 mmol/L (ref 135–145)

## 2015-09-27 LAB — CBC
HEMATOCRIT: 34.2 % — AB (ref 35.0–47.0)
Hemoglobin: 11.6 g/dL — ABNORMAL LOW (ref 12.0–16.0)
MCH: 27.2 pg (ref 26.0–34.0)
MCHC: 34 g/dL (ref 32.0–36.0)
MCV: 80 fL (ref 80.0–100.0)
PLATELETS: 190 10*3/uL (ref 150–440)
RBC: 4.28 MIL/uL (ref 3.80–5.20)
RDW: 15.1 % — AB (ref 11.5–14.5)
WBC: 5.9 10*3/uL (ref 3.6–11.0)

## 2015-09-27 LAB — TROPONIN I: Troponin I: <0.03 ng/mL (ref <0.03)

## 2015-09-27 LAB — LIPASE, BLOOD: LIPASE: 18 U/L (ref 11–51)

## 2015-09-27 MED ORDER — HYDROCHLOROTHIAZIDE 12.5 MG PO TABS: 12.5000 mg | ORAL_TABLET | Freq: Every day | ORAL | Status: DC

## 2015-09-27 MED ORDER — METOCLOPRAMIDE HCL 10 MG PO TABS: 10.0000 mg | ORAL_TABLET | Freq: Three times a day (TID) | ORAL | Status: DC

## 2015-09-27 MED ORDER — METOCLOPRAMIDE HCL 10 MG PO TABS
10.0000 mg | ORAL_TABLET | Freq: Once | ORAL | Status: DC
  Filled 2015-09-27: qty 1

## 2015-09-27 MED ORDER — PB-HYOSCY-ATROPINE-SCOPOLAMINE 16.2 MG/5ML PO ELIX
5.0000 mL | ORAL_SOLUTION | Freq: Once | ORAL | Status: DC
  Filled 2015-09-27: qty 5

## 2015-09-27 MED ORDER — ALUM & MAG HYDROXIDE-SIMETH 200-200-20 MG/5ML PO SUSP
30.0000 mL | Freq: Once | ORAL | Status: AC
  Administered 2015-09-27: 30 mL via ORAL
  Filled 2015-09-27: qty 30

## 2015-09-27 MED ORDER — FAMOTIDINE 20 MG PO TABS
40.0000 mg | ORAL_TABLET | Freq: Once | ORAL | Status: AC
  Administered 2015-09-27: 40 mg via ORAL
  Filled 2015-09-27: qty 2

## 2015-09-27 MED ORDER — FAMOTIDINE 20 MG PO TABS
20.0000 mg | ORAL_TABLET | Freq: Two times a day (BID) | ORAL | Status: DC
Start: 2015-09-27 — End: 2017-09-20

## 2015-09-27 MED ORDER — ALUMINUM-MAGNESIUM-SIMETHICONE 200-200-20 MG/5ML PO SUSP: 30.0000 mL | Freq: Three times a day (TID) | ORAL | Status: DC

## 2015-09-27 NOTE — Discharge Instructions (Signed)
Gastritis, Adult °Gastritis is soreness and swelling (inflammation) of the lining of the stomach. Gastritis can develop as a sudden onset (acute) or long-term (chronic) condition. If gastritis is not treated, it can lead to stomach bleeding and ulcers. °CAUSES  °Gastritis occurs when the stomach lining is weak or damaged. Digestive juices from the stomach then inflame the weakened stomach lining. The stomach lining may be weak or damaged due to viral or bacterial infections. One common bacterial infection is the Helicobacter pylori infection. Gastritis can also result from excessive alcohol consumption, taking certain medicines, or having too much acid in the stomach.  °SYMPTOMS  °In some cases, there are no symptoms. When symptoms are present, they may include: °· Pain or a burning sensation in the upper abdomen. °· Nausea. °· Vomiting. °· An uncomfortable feeling of fullness after eating. °DIAGNOSIS  °Your caregiver may suspect you have gastritis based on your symptoms and a physical exam. To determine the cause of your gastritis, your caregiver may perform the following: °· Blood or stool tests to check for the H pylori bacterium. °· Gastroscopy. A thin, flexible tube (endoscope) is passed down the esophagus and into the stomach. The endoscope has a light and camera on the end. Your caregiver uses the endoscope to view the inside of the stomach. °· Taking a tissue sample (biopsy) from the stomach to examine under a microscope. °TREATMENT  °Depending on the cause of your gastritis, medicines may be prescribed. If you have a bacterial infection, such as an H pylori infection, antibiotics may be given. If your gastritis is caused by too much acid in the stomach, H2 blockers or antacids may be given. Your caregiver may recommend that you stop taking aspirin, ibuprofen, or other nonsteroidal anti-inflammatory drugs (NSAIDs). °HOME CARE INSTRUCTIONS °· Only take over-the-counter or prescription medicines as directed by  your caregiver. °· If you were given antibiotic medicines, take them as directed. Finish them even if you start to feel better. °· Drink enough fluids to keep your urine clear or pale yellow. °· Avoid foods and drinks that make your symptoms worse, such as: °· Caffeine or alcoholic drinks. °· Chocolate. °· Peppermint or mint flavorings. °· Garlic and onions. °· Spicy foods. °· Citrus fruits, such as oranges, lemons, or limes. °· Tomato-based foods such as sauce, chili, salsa, and pizza. °· Fried and fatty foods. °· Eat small, frequent meals instead of large meals. °SEEK IMMEDIATE MEDICAL CARE IF:  °· You have black or dark red stools. °· You vomit blood or material that looks like coffee grounds. °· You are unable to keep fluids down. °· Your abdominal pain gets worse. °· You have a fever. °· You do not feel better after 1 week. °· You have any other questions or concerns. °MAKE SURE YOU: °· Understand these instructions. °· Will watch your condition. °· Will get help right away if you are not doing well or get worse. °  °This information is not intended to replace advice given to you by your health care provider. Make sure you discuss any questions you have with your health care provider. °  °Document Released: 03/05/2001 Document Revised: 09/10/2011 Document Reviewed: 04/24/2011 °Elsevier Interactive Patient Education ©2016 Elsevier Inc. ° °Nonspecific Chest Pain  °Chest pain can be caused by many different conditions. There is always a chance that your pain could be related to something serious, such as a heart attack or a blood clot in your lungs. Chest pain can also be caused by conditions that are   not life-threatening. If you have chest pain, it is very important to follow up with your health care provider. °CAUSES  °Chest pain can be caused by: °· Heartburn. °· Pneumonia or bronchitis. °· Anxiety or stress. °· Inflammation around your heart (pericarditis) or lung (pleuritis or pleurisy). °· A blood clot in  your lung. °· A collapsed lung (pneumothorax). It can develop suddenly on its own (spontaneous pneumothorax) or from trauma to the chest. °· Shingles infection (varicella-zoster virus). °· Heart attack. °· Damage to the bones, muscles, and cartilage that make up your chest wall. This can include: °¨ Bruised bones due to injury. °¨ Strained muscles or cartilage due to frequent or repeated coughing or overwork. °¨ Fracture to one or more ribs. °¨ Sore cartilage due to inflammation (costochondritis). °RISK FACTORS  °Risk factors for chest pain may include: °· Activities that increase your risk for trauma or injury to your chest. °· Respiratory infections or conditions that cause frequent coughing. °· Medical conditions or overeating that can cause heartburn. °· Heart disease or family history of heart disease. °· Conditions or health behaviors that increase your risk of developing a blood clot. °· Having had chicken pox (varicella zoster). °SIGNS AND SYMPTOMS °Chest pain can feel like: °· Burning or tingling on the surface of your chest or deep in your chest. °· Crushing, pressure, aching, or squeezing pain. °· Dull or sharp pain that is worse when you move, cough, or take a deep breath. °· Pain that is also felt in your back, neck, shoulder, or arm, or pain that spreads to any of these areas. °Your chest pain may come and go, or it may stay constant. °DIAGNOSIS °Lab tests or other studies may be needed to find the cause of your pain. Your health care provider may have you take a test called an ambulatory ECG (electrocardiogram). An ECG records your heartbeat patterns at the time the test is performed. You may also have other tests, such as: °· Transthoracic echocardiogram (TTE). During echocardiography, sound waves are used to create a picture of all of the heart structures and to look at how blood flows through your heart. °· Transesophageal echocardiogram (TEE). This is a more advanced imaging test that obtains  images from inside your body. It allows your health care provider to see your heart in finer detail. °· Cardiac monitoring. This allows your health care provider to monitor your heart rate and rhythm in real time. °· Holter monitor. This is a portable device that records your heartbeat and can help to diagnose abnormal heartbeats. It allows your health care provider to track your heart activity for several days, if needed. °· Stress tests. These can be done through exercise or by taking medicine that makes your heart beat more quickly. °· Blood tests. °· Imaging tests. °TREATMENT  °Your treatment depends on what is causing your chest pain. Treatment may include: °· Medicines. These may include: °¨ Acid blockers for heartburn. °¨ Anti-inflammatory medicine. °¨ Pain medicine for inflammatory conditions. °¨ Antibiotic medicine, if an infection is present. °¨ Medicines to dissolve blood clots. °¨ Medicines to treat coronary artery disease. °· Supportive care for conditions that do not require medicines. This may include: °¨ Resting. °¨ Applying heat or cold packs to injured areas. °¨ Limiting activities until pain decreases. °HOME CARE INSTRUCTIONS °· If you were prescribed an antibiotic medicine, finish it all even if you start to feel better. °· Avoid any activities that bring on chest pain. °· Do not use any tobacco   products, including cigarettes, chewing tobacco, or electronic cigarettes. If you need help quitting, ask your health care provider. °· Do not drink alcohol. °· Take medicines only as directed by your health care provider. °· Keep all follow-up visits as directed by your health care provider. This is important. This includes any further testing if your chest pain does not go away. °· If heartburn is the cause for your chest pain, you may be told to keep your head raised (elevated) while sleeping. This reduces the chance that acid will go from your stomach into your esophagus. °· Make lifestyle changes as  directed by your health care provider. These may include: °¨ Getting regular exercise. Ask your health care provider to suggest some activities that are safe for you. °¨ Eating a heart-healthy diet. A registered dietitian can help you to learn healthy eating options. °¨ Maintaining a healthy weight. °¨ Managing diabetes, if necessary. °¨ Reducing stress. °SEEK MEDICAL CARE IF: °· Your chest pain does not go away after treatment. °· You have a rash with blisters on your chest. °· You have a fever. °SEEK IMMEDIATE MEDICAL CARE IF:  °· Your chest pain is worse. °· You have an increasing cough, or you cough up blood. °· You have severe abdominal pain. °· You have severe weakness. °· You faint. °· You have chills. °· You have sudden, unexplained chest discomfort. °· You have sudden, unexplained discomfort in your arms, back, neck, or jaw. °· You have shortness of breath at any time. °· You suddenly start to sweat, or your skin gets clammy. °· You feel nauseous or you vomit. °· You suddenly feel light-headed or dizzy. °· Your heart begins to beat quickly, or it feels like it is skipping beats. °These symptoms may represent a serious problem that is an emergency. Do not wait to see if the symptoms will go away. Get medical help right away. Call your local emergency services (911 in the [place] Do not drive yourself to the hospital. °  °This information is not intended to replace advice given to you by your health care provider. Make sure you discuss any questions you have with your health care provider. °  °Document Released: 12/19/2004 Document Revised: 04/01/2014 Document Reviewed: 10/15/2013 °Elsevier Interactive Patient Education ©2016 Else[employer] Inc. ° °

## 2015-09-27 NOTE — ED Notes (Signed)
Patient transported to x-ray. ?

## 2015-09-27 NOTE — ED Notes (Signed)
Patient presents to the ED with chest pain and nausea since 2pm.  Patient states pain radiates into her left shoulder.  Patient describes pain as aching on the left side of her chest.  Patient reports recent stress test due to EKG changes and chest pain.  Patient is in no obvious distress at this time.  Patient denies shortness of breath and diaphoresis.

## 2015-09-27 NOTE — ED Provider Notes (Signed)
[facility] Emergency Department Provider Note  ____________________________________________  Time seen: 4:50 PM  I have reviewed the triage vital signs and the nursing notes.   HISTORY  Chief Complaint Chest Pain    HPI Shirley Stewart is a 35 y.o. female who complains of gradual onset constant chest pain since 2 PM. It felt like the pain was gradually getting worse, but is now gradually getting better. Radiates to her left shoulder. Feels sharp. No shortness of breath vomiting diaphoresis dizziness or syncope. Not exertional. Not pleuritic. He does have a history of acid reflux, has not taken antacids for the past year. She tried Alka-Seltzer at home without relief. No nausea vomiting diarrhea.     Past Medical History  Diagnosis Date  . Hypertension   . GERD (gastroesophageal reflux disease)      There are no active problems to display for this patient.    Past Surgical History  Procedure Laterality Date  . Tubal ligation       Current Outpatient Rx  Name  Route  Sig  Dispense  Refill  . aluminum-magnesium hydroxide-simethicone (MAALOX) 200-200-20 MG/5ML SUSP   Oral   Take 30 mLs by mouth 4 (four) times daily -  before meals and at bedtime.   355 mL   0   . amoxicillin-clavulanate (AUGMENTIN) 875-125 MG tablet   Oral   Take 1 tablet by mouth 2 (two) times daily.   14 tablet   0   . cetirizine (ZYRTEC) 10 MG tablet   Oral   Take 1 tablet (10 mg total) by mouth daily.   30 tablet   0   . ciprofloxacin (CILOXAN) 0.3 % ophthalmic solution   Both Eyes   Place 2 drops into both eyes every 4 (four) hours while awake. Administer 1 drop, every 2 hours, while awake, for 2 days. Then 1 drop, every 4 hours, while awake, for the next 5 days.   5 mL   0   . famotidine (PEPCID) 20 MG tablet   Oral   Take 1 tablet (20 mg total) by mouth 2 (two) times daily.   60 tablet   0   . fluticasone (FLONASE) 50 MCG/ACT nasal spray   Each Nare  Place 1 spray into both nostrils 2 (two) times daily.   16 g   0   . hydrochlorothiazide (HYDRODIURIL) 12.5 MG tablet   Oral   Take 1 tablet (12.5 mg total) by mouth daily.   30 tablet   0   . ibuprofen (ADVIL,MOTRIN) 600 MG tablet   Oral   Take 1 tablet (600 mg total) by mouth every 8 (eight) hours as needed.   20 tablet   0   . magic mouthwash w/lidocaine SOLN   Oral   Take 5 mLs by mouth 4 (four) times daily.   240 mL   0     Dispense in a 1/1/1/1 ratio   . metoCLOPramide (REGLAN) 10 MG tablet   Oral   Take 1 tablet (10 mg total) by mouth 4 (four) times daily -  before meals and at bedtime.   60 tablet   0   . pantoprazole (PROTONIX) 40 MG tablet   Oral   Take 1 tablet (40 mg total) by mouth daily.   30 tablet   0      Allergies Review of patient's allergies indicates no known allergies.   No family history on file.  Social History Social History  Substance Use Topics  .  Smoking status: Former Smoker -- 1.00 packs/day    Types: Cigarettes    Quit date: 07/28/2015  . Smokeless tobacco: None  . Alcohol Use: No    Review of Systems  Constitutional:   No fever or chills.  ENT:   No sore throat. No rhinorrhea. Cardiovascular:   Positive as above chest pain. Respiratory:   No dyspnea or cough. Gastrointestinal:   Negative for abdominal pain, vomiting and diarrhea.  Genitourinary:   Negative for dysuria or difficulty urinating.  Neurological:   Positive bilateral frontal headache 10-point ROS otherwise negative.  ____________________________________________   PHYSICAL EXAM:  VITAL SIGNS: ED Triage Vitals  Enc Vitals Group     BP 09/27/15 1619 140/87 mmHg     Pulse Rate 09/27/15 1619 86     Resp 09/27/15 1619 18     Temp 09/27/15 1619 98.8 F (37.1 C)     Temp Source 09/27/15 1619 Oral     SpO2 09/27/15 1619 100 %     Weight 09/27/15 1619 160 lb (72.576 kg)     Height 09/27/15 1619 5' (1.524 m)     Head Cir --      Peak Flow --       Pain Score 09/27/15 1620 6     Pain Loc --      Pain Edu? --      Excl. in GC? --     Vital signs reviewed, nursing assessments reviewed.   Constitutional:   Alert and oriented. Well appearing and in no distress. Eyes:   No scleral icterus. No conjunctival pallor. PERRL. EOMI.  No nystagmus. ENT   Head:   Normocephalic and atraumatic.   Nose:   No congestion/rhinnorhea. No septal hematoma   Mouth/Throat:   MMM, no pharyngeal erythema. No peritonsillar mass.    Neck:   No stridor. No SubQ emphysema. No meningismus. Hematological/Lymphatic/Immunilogical:   No cervical lymphadenopathy. Cardiovascular:   RRR. Symmetric bilateral radial and DP pulses.  No murmurs.  Respiratory:   Normal respiratory effort without tachypnea nor retractions. Breath sounds are clear and equal bilaterally. No wheezes/rales/rhonchi. Gastrointestinal:   Soft With epigastric and left upper quadrant tenderness. Non distended. There is no CVA tenderness.  No rebound, rigidity, or guarding. Genitourinary:   deferred Musculoskeletal:   Nontender with normal range of motion in all extremities. No joint effusions.  No lower extremity tenderness.  No edema. Chest wall nontender Neurologic:   Normal speech and language.  CN 2-10 normal. Motor grossly intact. No gross focal neurologic deficits are appreciated.  Skin:    Skin is warm, dry and intact. No rash noted.  No petechiae, purpura, or bullae.  ____________________________________________    LABS (pertinent positives/negatives) (all labs ordered are listed, but only abnormal results are displayed) Labs Reviewed  BASIC METABOLIC PANEL - Abnormal; Notable for the following:    Creatinine, Ser 1.03 (*)    Calcium 8.7 (*)    All other components within normal limits  CBC - Abnormal; Notable for the following:    Hemoglobin 11.6 (*)    HCT 34.2 (*)    RDW 15.1 (*)    All other components within normal limits  HEPATIC FUNCTION PANEL - Abnormal;  Notable for the following:    ALT 10 (*)    Bilirubin, Direct <0.1 (*)    All other components within normal limits  TROPONIN I  LIPASE, BLOOD   ____________________________________________   EKG  Interpreted by me  Date: 09/27/2015  Rate: 80  Rhythm: normal sinus rhythm  QRS Axis: normal  Intervals: normal  ST/T Wave abnormalities: normal  Conduction Disutrbances: none  Narrative Interpretation: unremarkable      ____________________________________________    RADIOLOGY  Chest x-ray unremarkable  ____________________________________________   PROCEDURES   ____________________________________________   INITIAL IMPRESSION / ASSESSMENT AND PLAN / ED COURSE  Pertinent labs & imaging results that were available during my care of the patient were reviewed by me and considered in my medical decision making (see chart for details).  Patient well appearing no acute distress. Presents with atypical chest pain. Considering the patient's symptoms, medical history, and physical examination today, I have low suspicion for ACS, PE, TAD, pneumothorax, carditis, mediastinitis, pneumonia, CHF, or sepsis. Highly suspect that her current symptoms are related to GERD. She has stopped smoking in the last few months and within the last 2 months has had transthoracic echocardiogram and stress test performed by cardiology which were both unremarkable.  ----------------------------------------- 7:56 PM on 09/27/2015 -----------------------------------------  Patient well appearing no acute distress. Symptoms have improved after GI cocktail. Labs completely normal. Vital signs unremarkable. EKG and chest x-ray unremarkable.Considering the patient's symptoms, medical history, and physical examination today, I have low suspicion for ACS, PE, TAD, pneumothorax, carditis, mediastinitis, pneumonia, CHF, or sepsis.  Exam is consistent with GERD or gastritis. We'll continue on acid suppression  therapy and GI cocktail, follow-up with primary care. Patient reports that she is supposed to take HCTZ 25 mg daily for blood pressure, but she doesn't take it because it gives her severe dry mouth. She is therefore been noncompliant with this but did take one today when she noticed her blood pressure was elevated to 160 systolic. His blood pressure consistently in the 130s systolic here, likely 12.5 mg will be adequate, and if she has improved compliance on a lower dose, and it seems reasonable to prescribe the lower dose for now given her hesitation about taking the regular dose. I written a new prescription for the 12.5 mg. Advised her to follow up with her primary care doctor within the next week.       ____________________________________________   FINAL CLINICAL IMPRESSION(S) / ED DIAGNOSES  Final diagnoses:  Nonspecific chest pain  Gastritis  Atypical chest pain     Portions of this note were generated with dragon dictation software. Dictation errors may occur despite best attempts at proofreading.   Sharman CheekPhillip Eisen Robenson, MD 09/27/15 [phone]

## 2015-11-05 ENCOUNTER — Emergency Department
Admission: EM | Admit: 2015-11-05 | Discharge: 2015-11-05 | Disposition: A | Discharge status: Home / Self Care | Payer: Medicaid Other | Attending: Student in an Organized Health Care Education/Training Program | Admitting: Student in an Organized Health Care Education/Training Program

## 2015-11-05 DIAGNOSIS — I10 Essential (primary) hypertension: Secondary | ICD-10-CM | POA: Diagnosis not present

## 2015-11-05 DIAGNOSIS — H5712 Ocular pain, left eye: Secondary | ICD-10-CM | POA: Diagnosis present

## 2015-11-05 DIAGNOSIS — Z87891 Personal history of nicotine dependence: Secondary | ICD-10-CM | POA: Insufficient documentation

## 2015-11-05 DIAGNOSIS — B0052 Herpesviral keratitis: Secondary | ICD-10-CM | POA: Insufficient documentation

## 2015-11-05 MED ORDER — KETOROLAC TROMETHAMINE 0.5 % OP SOLN: 1.0000 [drp] | Freq: Four times a day (QID) | OPHTHALMIC | 0 refills | Status: DC

## 2015-11-05 MED ORDER — FLUORESCEIN SODIUM 1 MG OP STRP
1.0000 | ORAL_STRIP | Freq: Once | OPHTHALMIC | Status: AC
  Administered 2015-11-05: 1 via OPHTHALMIC
  Filled 2015-11-05: qty 1

## 2015-11-05 MED ORDER — TETRACAINE HCL 0.5 % OP SOLN
2.0000 [drp] | Freq: Once | OPHTHALMIC | Status: AC
  Administered 2015-11-05: 2 [drp] via OPHTHALMIC
  Filled 2015-11-05: qty 2

## 2015-11-05 MED ORDER — CIPROFLOXACIN HCL 0.3 % OP SOLN: 1.0000 [drp] | OPHTHALMIC | 0 refills | Status: AC

## 2015-11-05 NOTE — ED Provider Notes (Signed)
[facility] Emergency Department Provider Note  ____________________________________________  Time seen: Approximately 4:33 PM  I have reviewed the triage vital signs and the nursing notes.   HISTORY  Chief Complaint Eye Pain    HPI Shirley Stewart is a 35 y.o. female who presents emergency department complaining of left eye pain. 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 She removed her contact yesterday but has continued to have the sensation. Patient denies any trauma to the eye. She denies any visual changes. She denies any other URIs symptoms. Patient denies any drainage from the eye. She is not taking any medications prior to arrival. Pain is sharp/scratchy, constant.   Past Medical History:  Diagnosis Date  . GERD (gastroesophageal reflux disease)   . Hypertension     There are no active problems to display for this patient.   Past Surgical History:  Procedure Laterality Date  . TUBAL LIGATION      Prior to Admission medications   Medication Sig Start Date End Date Taking? Authorizing Provider  aluminum-magnesium hydroxide-simethicone (MAALOX) 200-200-20 MG/5ML SUSP Take 30 mLs by mouth 4 (four) times daily -  before meals and at bedtime. 09/27/15   Sharman Cheek, MD  amoxicillin-clavulanate (AUGMENTIN) 875-125 MG tablet Take 1 tablet by mouth 2 (two) times daily. 01/26/15   Delorise Royals Tayah Idrovo, PA-C  cetirizine (ZYRTEC) 10 MG tablet Take 1 tablet (10 mg total) by mouth daily. 01/26/15   Delorise Royals Sadiya Durand, PA-C  ciprofloxacin (CILOXAN) 0.3 % ophthalmic solution Place 1 drop into the left eye every 2 (two) hours. Administer 1 drop, every 2 hours, while awake, for 2 days. Then 1 drop, every 4 hours, while awake, for the next 5 days. 11/05/15 11/10/15  Christiane Ha D Medard Decuir, PA-C  famotidine (PEPCID) 20 MG tablet Take 1 tablet (20 mg total) by mouth 2  (two) times daily. 09/27/15   Sharman Cheek, MD  fluticasone [facility]) 50 MCG/ACT nasal spray Place 1 spray into both nostrils 2 (two) times daily. 01/26/15   Delorise Royals Jceon Alverio, PA-C  hydrochlorothiazide (HYDRODIURIL) 12.5 MG tablet Take 1 tablet (12.5 mg total) by mouth daily. 09/27/15   Sharman Cheek, MD  ibuprofen (ADVIL,MOTRIN) 600 MG tablet Take 1 tablet (600 mg total) by mouth every 8 (eight) hours as needed. 07/23/15   Governor Rooks, MD  ketorolac (ACULAR) 0.5 % ophthalmic solution Place 1 drop into the right eye 4 (four) times daily. 11/05/15   Delorise Royals Danialle Dement, PA-C  magic mouthwash w/lidocaine SOLN Take 5 mLs by mouth 4 (four) times daily. 01/26/15   Delorise Royals Nicolas Banh, PA-C  metoCLOPramide (REGLAN) 10 MG tablet Take 1 tablet (10 mg total) by mouth 4 (four) times daily -  before meals and at bedtime. 09/27/15   Sharman Cheek, MD  pantoprazole (PROTONIX) 40 MG tablet Take 1 tablet (40 mg total) by mouth daily. 10/26/14   Darci Current, MD    Allergies Review of patient's allergies indicates no known allergies.  No family history on file.  Social History Social History  Substance Use Topics  . Smoking status: Former Smoker    Packs/day: 1.00    Types: Cigarettes    Quit date: 07/28/2015  . Smokeless tobacco: Never Used  . Alcohol use No     Review of Systems  Constitutional: No fever/chills Eyes: No visual changes. No discharge. Left eye pain. ENT: No upper respiratory complaints. Cardiovascular: no  chest pain. Respiratory: no cough. No SOB. Skin: Negative for rash, abrasions, lacerations, ecchymosis. Neurological: Negative for headaches, focal weakness or numbness. 10-point ROS otherwise negative.  ____________________________________________   PHYSICAL EXAM:  VITAL SIGNS: ED Triage Vitals  Enc Vitals Group     BP 11/05/15 1602 129/74     Pulse Rate 11/05/15 1602 88     Resp 11/05/15 1602 16     Temp 11/05/15 1602 98.9 F (37.2 C)     Temp Source  11/05/15 1602 Oral     SpO2 11/05/15 1602 97 %     Weight 11/05/15 1559 160 lb (72.6 kg)     Height 11/05/15 1559 5' (1.524 m)     Head Circumference --      Peak Flow --      Pain Score --      Pain Loc --      Pain Edu? --      Excl. in GC? --      Constitutional: Alert and oriented. Well appearing and in no acute distress. Eyes: Conjunctivae are normal.Funduscopic exam is unremarkable bilaterally. No visible foreign body. Red reflex present bilaterally. Vasculature and optic disc are unremarkable bilaterally. Fluorescein staining reveals area of uptake in the 6:00 position left cornea. PERRL. EOMI. Head: Atraumatic. ENT:      Ears:       Nose: No congestion/rhinnorhea.      Mouth/Throat: Mucous membranes are moist.  Neck: No stridor.    Cardiovascular: Normal rate, regular rhythm. Normal S1 and S2.  Good peripheral circulation. Respiratory: Normal respiratory effort without tachypnea or retractions. Lungs CTAB. Good air entry to the bases with no decreased or absent breath sounds. Musculoskeletal: Full range of motion to all extremities. No gross deformities appreciated. Neurologic:  Normal speech and language. No gross focal neurologic deficits are appreciated.  Skin:  Skin is warm, dry and intact. No rash noted. Psychiatric: Mood and affect are normal. Speech and behavior are normal. Patient exhibits appropriate insight and judgement.   ____________________________________________   LABS (all labs ordered are listed, but only abnormal results are displayed)  Labs Reviewed - No data to display ____________________________________________  EKG   ____________________________________________  RADIOLOGY   No results found.  ____________________________________________    PROCEDURES  Procedure(s) performed:    Procedures    Medications  fluorescein ophthalmic strip 1 strip (1 strip Left Eye Given 11/05/15 1713)  tetracaine (PONTOCAINE) 0.5 % ophthalmic  solution 2 drop (2 drops Left Eye Given 11/05/15 1713)     ____________________________________________   INITIAL IMPRESSION / ASSESSMENT AND PLAN / ED COURSE  Pertinent labs & imaging results that were available during my care of the patient were reviewed by me and considered in my medical decision making (see chart for details).  Clinical Course    Patient's diagnosis is consistent with Dendritic ulcer to the left eye. Exam is reassuring. Patient is given instructions not to wear contacts until after antibiotics are finished. Patient will discard the set of contact lenses.. Patient will be discharged home with prescriptions for Cipro eyedrops. Patient is to follow up with primary care provider or ophthalmology as needed or otherwise directed. Patient is given ED precautions to return to the ED for any worsening or new symptoms.     ____________________________________________  FINAL CLINICAL IMPRESSION(S) / ED DIAGNOSES  Final diagnoses:  Dendritic ulcer      NEW MEDICATIONS STARTED DURING THIS VISIT:  New Prescriptions   CIPROFLOXACIN (CILOXAN) 0.3 % OPHTHALMIC SOLUTION  Place 1 drop into the left eye every 2 (two) hours. Administer 1 drop, every 2 hours, while awake, for 2 days. Then 1 drop, every 4 hours, while awake, for the next 5 days.   KETOROLAC (ACULAR) 0.5 % OPHTHALMIC SOLUTION    Place 1 drop into the right eye 4 (four) times daily.        This chart was dictated using voice recognition software/Dragon. Despite best efforts to proofread, errors can occur which can change the meaning. Any change was purely unintentional.    Racheal Patches, PA-C 11/05/15 1716    Delorise Royals Stamatia Masri, PA-C 11/05/15 1717    Willy Eddy, MD 11/05/15 1742

## 2016-03-17 ENCOUNTER — Emergency Department: Payer: Medicaid Other

## 2016-03-17 ENCOUNTER — Emergency Department
Admission: EM | Admit: 2016-03-17 | Discharge: 2016-03-17 | Disposition: A | Discharge status: Home / Self Care | Payer: Medicaid Other | Attending: Emergency Medicine | Admitting: Emergency Medicine

## 2016-03-17 ENCOUNTER — Encounter: Payer: Self-pay | Admitting: Intensive Care

## 2016-03-17 DIAGNOSIS — Z791 Long term (current) use of non-steroidal anti-inflammatories (NSAID): Secondary | ICD-10-CM | POA: Insufficient documentation

## 2016-03-17 DIAGNOSIS — Z87891 Personal history of nicotine dependence: Secondary | ICD-10-CM | POA: Insufficient documentation

## 2016-03-17 DIAGNOSIS — J069 Acute upper respiratory infection, unspecified: Secondary | ICD-10-CM | POA: Insufficient documentation

## 2016-03-17 DIAGNOSIS — I10 Essential (primary) hypertension: Secondary | ICD-10-CM | POA: Insufficient documentation

## 2016-03-17 DIAGNOSIS — Z79899 Other long term (current) drug therapy: Secondary | ICD-10-CM | POA: Insufficient documentation

## 2016-03-17 LAB — BASIC METABOLIC PANEL
ANION GAP: 7 (ref 5–15)
BUN: 7 mg/dL (ref 6–20)
CHLORIDE: 104 mmol/L (ref 101–111)
CO2: 24 mmol/L (ref 22–32)
Calcium: 9.5 mg/dL (ref 8.9–10.3)
Creatinine, Ser: 0.89 mg/dL (ref 0.44–1.00)
GFR calc non Af Amer: >60 mL/min (ref >60)
Glucose, Bld: 101 mg/dL — ABNORMAL HIGH (ref 65–99)
POTASSIUM: 4.2 mmol/L (ref 3.5–5.1)
SODIUM: 135 mmol/L (ref 135–145)

## 2016-03-17 LAB — CBC
HEMATOCRIT: 34.6 % — AB (ref 35.0–47.0)
Hemoglobin: 11.4 g/dL — ABNORMAL LOW (ref 12.0–16.0)
MCH: 26 pg (ref 26.0–34.0)
MCHC: 33 g/dL (ref 32.0–36.0)
MCV: 78.7 fL — AB (ref 80.0–100.0)
PLATELETS: 183 10*3/uL (ref 150–440)
RBC: 4.39 MIL/uL (ref 3.80–5.20)
RDW: 15.9 % — ABNORMAL HIGH (ref 11.5–14.5)
WBC: 6 10*3/uL (ref 3.6–11.0)

## 2016-03-17 LAB — TROPONIN I: Troponin I: <0.03 ng/mL (ref <0.03)

## 2016-03-17 MED ORDER — GUAIFENESIN-CODEINE 100-10 MG/5ML PO SOLN: 5.0000 mL | Freq: Four times a day (QID) | ORAL | 0 refills | Status: DC | PRN

## 2016-03-17 NOTE — ED Notes (Signed)
Pt c/o CP radiating into throat, c/o chills and generalized pain with cough. Pt works geriatrics. Pt denies fever. Pt A&Ox4, ambulatory to RM.

## 2016-03-17 NOTE — ED Notes (Signed)
POC strep negative

## 2016-03-17 NOTE — ED Triage Notes (Signed)
Patient c/o central chest tightness, SOB, and throat pain that started last night. Reports pain in worse when coughing. 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 Patient has HX of strep throat. Ambulatory in triage with NAD Noted. A&O x4

## 2016-03-17 NOTE — ED Provider Notes (Signed)
[facility] Emergency Department Provider Note  Time seen: 4:01 PM  I have reviewed the triage vital signs and the nursing notes.   HISTORY  Chief Complaint Chest Pain    HPI Shirley CatalanJennifer Stewart is a 35 y.o. female With a past medical history of hypertension, gastric reflux who presents to the emergency department with cough, congestion, sore throat. According to the patient for the past 2 days she has been coughing experiencing a burning pain in her chest and throat with coughing. States significant congestion, runny nose. Chills. 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 Denies any chest pain at rest but does state pain with coughing which she describes as sharp.  Past Medical History:  Diagnosis Date  . GERD (gastroesophageal reflux disease)   . Hypertension     There are no active problems to display for this patient.   Past Surgical History:  Procedure Laterality Date  . TUBAL LIGATION      Prior to Admission medications   Medication Sig Start Date End Date Taking? Authorizing Provider  aluminum-magnesium hydroxide-simethicone (MAALOX) 200-200-20 MG/5ML SUSP Take 30 mLs by mouth 4 (four) times daily -  before meals and at bedtime. 09/27/15   Sharman CheekPhillip Stafford, MD  amoxicillin-clavulanate (AUGMENTIN) 875-125 MG tablet Take 1 tablet by mouth 2 (two) times daily. 01/26/15   Delorise RoyalsJonathan D Cuthriell, PA-C  cetirizine (ZYRTEC) 10 MG tablet Take 1 tablet (10 mg total) by mouth daily. 01/26/15   Delorise RoyalsJonathan D Cuthriell, PA-C  famotidine (PEPCID) 20 MG tablet Take 1 tablet (20 mg total) by mouth 2 (two) times daily. 09/27/15   Sharman CheekPhillip Stafford, MD  fluticasone [facility](FLONASE) 50 MCG/ACT nasal spray Place 1 spray into both nostrils 2 (two) times daily. 01/26/15   Delorise RoyalsJonathan D Cuthriell, PA-C  hydrochlorothiazide (HYDRODIURIL) 12.5 MG tablet Take 1 tablet (12.5 mg total) by mouth daily. 09/27/15   Sharman CheekPhillip Stafford, MD   ibuprofen (ADVIL,MOTRIN) 600 MG tablet Take 1 tablet (600 mg total) by mouth every 8 (eight) hours as needed. 07/23/15   Governor Rooksebecca Lord, MD  ketorolac (ACULAR) 0.5 % ophthalmic solution Place 1 drop into the right eye 4 (four) times daily. 11/05/15   Delorise RoyalsJonathan D Cuthriell, PA-C  magic mouthwash w/lidocaine SOLN Take 5 mLs by mouth 4 (four) times daily. 01/26/15   Delorise RoyalsJonathan D Cuthriell, PA-C  metoCLOPramide (REGLAN) 10 MG tablet Take 1 tablet (10 mg total) by mouth 4 (four) times daily -  before meals and at bedtime. 09/27/15   Sharman CheekPhillip Stafford, MD  pantoprazole (PROTONIX) 40 MG tablet Take 1 tablet (40 mg total) by mouth daily. 10/26/14   Darci Currentandolph N Brown, MD    No Known Allergies  History reviewed. No pertinent family history.  Social History Social History  Substance Use Topics  . Smoking status: Former Smoker    Packs/day: 1.00    Types: Cigarettes    Quit date: 07/28/2015  . Smokeless tobacco: Never Used  . Alcohol use No    Review of Systems Constitutional: positive for chills. No measured temperature per patient. ENT: positive for congestion Cardiovascular: occasional chest discomfort with cough Respiratory: Nedenies any shortness of breath currently. Positive for cough. Gastrointestinal: Negative for abdominal pain Neurological: Negative for headache 10-point ROS otherwise negative.  ____________________________________________   PHYSICAL EXAM:  VITAL SIGNS: ED Triage Vitals  Enc Vitals Group     BP 03/17/16 1451 120/85     Pulse Rate 03/17/16 1451 (!) 114  Resp 03/17/16 1451 20     Temp 03/17/16 1451 99 F (37.2 C)     Temp Source 03/17/16 1451 Oral     SpO2 03/17/16 1451 100 %     Weight 03/17/16 1448 180 lb (81.6 kg)     Height 03/17/16 1448 5' (1.524 m)     Head Circumference --      Peak Flow --      Pain Score 03/17/16 1448 8     Pain Loc --      Pain Edu? --      Excl. in GC? --     Constitutional: Alert and oriented. Well appearing and in no  distress. Eyes: Normal exam ENT   Head: Normocephalic and atraumatic.   Mouth/Throat: Mucous membranes are moist.moderate pharyngeal erythema, no exudate or hypertrophy. Cardiovascular: Normal rate, regular rhythm, Round 90-100 bpm. Respiratory: Normal respiratory effort without tachypnea nor retractions. Breath sounds are clear  Gastrointestinal: Soft and nontender. No distention.   Musculoskeletal: Nontender with normal range of motion in all extremities. Neurologic:  Normal speech and language. No gross focal neurologic deficits Skin:  Skin is warm, dry and intact.  Psychiatric: Mood and affect are normal.   ____________________________________________    EKG  EKG reviewed and interpreted by myself shows normal sinus rhythm at 94 bpm, narrow QRS, normal axis, normal intervals, no  concerning ST changes.  ____________________________________________    RADIOLOGY  chest x-ray negative  ____________________________________________   INITIAL IMPRESSION / ASSESSMENT AND PLAN / ED COURSE  Pertinent labs & imaging results that were available during my care of the patient were reviewed by me and considered in my medical decision making (see chart for details).  patient presents the emergency department with upper respiratory symptoms. Patient's labs are largely within normal limits, strep is negative, troponin is negative, chest x-ray is negative, EKG is reassuring. Highly suspect viral upper respiratory infection. I discussed supportive care with Tylenol/Motrin, plenty of fluids and plenty of rest. We will also prescribe a cough medication for the patient. Patient will follow up with her primary care doctor.  ____________________________________________   FINAL CLINICAL IMPRESSION(S) / ED DIAGNOSES  upper respiratory infection    Minna AntisKevin Cammie Faulstich, MD 03/17/16 [phone]

## 2016-03-20 LAB — CULTURE, GROUP A STREP (THRC)

## 2016-07-30 ENCOUNTER — Emergency Department: Payer: Managed Care, Other (non HMO)

## 2016-07-30 ENCOUNTER — Emergency Department
Admission: EM | Admit: 2016-07-30 | Discharge: 2016-07-30 | Disposition: A | Discharge status: Home / Self Care | Payer: Managed Care, Other (non HMO) | Attending: Student in an Organized Health Care Education/Training Program | Admitting: Student in an Organized Health Care Education/Training Program

## 2016-07-30 DIAGNOSIS — J029 Acute pharyngitis, unspecified: Secondary | ICD-10-CM | POA: Insufficient documentation

## 2016-07-30 DIAGNOSIS — H578 Other specified disorders of eye and adnexa: Secondary | ICD-10-CM | POA: Insufficient documentation

## 2016-07-30 DIAGNOSIS — R072 Precordial pain: Secondary | ICD-10-CM | POA: Insufficient documentation

## 2016-07-30 DIAGNOSIS — Z79899 Other long term (current) drug therapy: Secondary | ICD-10-CM | POA: Diagnosis not present

## 2016-07-30 DIAGNOSIS — I10 Essential (primary) hypertension: Secondary | ICD-10-CM | POA: Diagnosis not present

## 2016-07-30 DIAGNOSIS — Z87891 Personal history of nicotine dependence: Secondary | ICD-10-CM | POA: Insufficient documentation

## 2016-07-30 DIAGNOSIS — H5789 Other specified disorders of eye and adnexa: Secondary | ICD-10-CM

## 2016-07-30 DIAGNOSIS — R1013 Epigastric pain: Secondary | ICD-10-CM | POA: Diagnosis not present

## 2016-07-30 DIAGNOSIS — R079 Chest pain, unspecified: Secondary | ICD-10-CM

## 2016-07-30 LAB — CBC
HCT: 34.6 % — ABNORMAL LOW (ref 35.0–47.0)
Hemoglobin: 11.3 g/dL — ABNORMAL LOW (ref 12.0–16.0)
MCH: 23.5 pg — AB (ref 26.0–34.0)
MCHC: 32.6 g/dL (ref 32.0–36.0)
MCV: 71.9 fL — ABNORMAL LOW (ref 80.0–100.0)
PLATELETS: 221 10*3/uL (ref 150–440)
RBC: 4.81 MIL/uL (ref 3.80–5.20)
RDW: 15.9 % — AB (ref 11.5–14.5)
WBC: 6.5 10*3/uL (ref 3.6–11.0)

## 2016-07-30 LAB — BASIC METABOLIC PANEL
Anion gap: 8 (ref 5–15)
BUN: 10 mg/dL (ref 6–20)
CALCIUM: 9.1 mg/dL (ref 8.9–10.3)
CO2: 26 mmol/L (ref 22–32)
CREATININE: 0.76 mg/dL (ref 0.44–1.00)
Chloride: 104 mmol/L (ref 101–111)
GFR calc Af Amer: >60 mL/min (ref >60)
GFR calc non Af Amer: >60 mL/min (ref >60)
Glucose, Bld: 96 mg/dL (ref 65–99)
Potassium: 3.7 mmol/L (ref 3.5–5.1)
SODIUM: 138 mmol/L (ref 135–145)

## 2016-07-30 LAB — TROPONIN I

## 2016-07-30 MED ORDER — HYDROCHLOROTHIAZIDE 12.5 MG PO TABS: 12.5000 mg | ORAL_TABLET | Freq: Every day | ORAL | 0 refills | Status: DC

## 2016-07-30 MED ORDER — DEXAMETHASONE 4 MG PO TABS: 10.0000 mg | ORAL_TABLET | Freq: Once | ORAL | 0 refills | Status: AC

## 2016-07-30 MED ORDER — TETRAHYDROZOLINE HCL 0.05 % OP SOLN: 2.0000 [drp] | Freq: Two times a day (BID) | OPHTHALMIC | 0 refills | Status: DC

## 2016-07-30 MED ORDER — NAPROXEN 500 MG PO TABS: 500.0000 mg | ORAL_TABLET | Freq: Two times a day (BID) | ORAL | 0 refills | Status: DC

## 2016-07-30 MED ORDER — ALBUTEROL SULFATE HFA 108 (90 BASE) MCG/ACT IN AERS: 2.0000 | INHALATION_SPRAY | Freq: Four times a day (QID) | RESPIRATORY_TRACT | 2 refills | Status: DC | PRN

## 2016-07-30 NOTE — ED Provider Notes (Signed)
[facility] Emergency Department Provider Note    First MD Initiated Contact with Patient 07/30/16 1818     (approximate)  I have reviewed the triage vital signs and the nursing notes.   HISTORY  Chief Complaint Chest Pain    HPI Shirley Stewart is a 36 y.o. female with a history of GERD and hypertension presents with 4 days of midsternal chest pain, cough, sore throat, ear pain, H she denies in her eyes bilaterally and nausea. She's not had any vomiting. No measured fevers. No exertional chest pain. Pain does not worsen with position. Has not had any productive cough. She stopped smoking 1 year ago. Does work at nursing facility with many sick contacts. Does have a history of seasonal allergies but has never been this bad.   Past Medical History:  Diagnosis Date  . GERD (gastroesophageal reflux disease)   . Hypertension    No family history on file. Past Surgical History:  Procedure Laterality Date  . TUBAL LIGATION     There are no active problems to display for this patient.     Prior to Admission medications   Medication Sig Start Date End Date Taking? Authorizing Provider  aluminum-magnesium hydroxide-simethicone (MAALOX) 200-200-20 MG/5ML SUSP Take 30 mLs by mouth 4 (four) times daily -  before meals and at bedtime. 09/27/15   Sharman Cheek, MD  amoxicillin-clavulanate (AUGMENTIN) 875-125 MG tablet Take 1 tablet by mouth 2 (two) times daily. 01/26/15   Cuthriell, Delorise Royals, PA-C  cetirizine (ZYRTEC) 10 MG tablet Take 1 tablet (10 mg total) by mouth daily. 01/26/15   Cuthriell, Delorise Royals, PA-C  famotidine (PEPCID) 20 MG tablet Take 1 tablet (20 mg total) by mouth 2 (two) times daily. 09/27/15   Sharman Cheek, MD  fluticasone [facility]) 50 MCG/ACT nasal spray Place 1 spray into both nostrils 2 (two) times daily. 01/26/15   Cuthriell, Delorise Royals, PA-C  guaiFENesin-codeine 100-10 MG/5ML syrup Take 5 mLs by mouth every 6 (six) hours as needed for  cough. 03/17/16   Minna Antis, MD  hydrochlorothiazide (HYDRODIURIL) 12.5 MG tablet Take 1 tablet (12.5 mg total) by mouth daily. 09/27/15   Sharman Cheek, MD  ibuprofen (ADVIL,MOTRIN) 600 MG tablet Take 1 tablet (600 mg total) by mouth every 8 (eight) hours as needed. 07/23/15   Governor Rooks, MD  ketorolac (ACULAR) 0.5 % ophthalmic solution Place 1 drop into the right eye 4 (four) times daily. 11/05/15   Cuthriell, Delorise Royals, PA-C  magic mouthwash w/lidocaine SOLN Take 5 mLs by mouth 4 (four) times daily. 01/26/15   Cuthriell, Delorise Royals, PA-C  metoCLOPramide (REGLAN) 10 MG tablet Take 1 tablet (10 mg total) by mouth 4 (four) times daily -  before meals and at bedtime. 09/27/15   Sharman Cheek, MD  pantoprazole (PROTONIX) 40 MG tablet Take 1 tablet (40 mg total) by mouth daily. 10/26/14   Darci Current, MD    Allergies Patient has no known allergies.    Social History Social History  Substance Use Topics  . Smoking status: Former Smoker    Packs/day: 1.00    Types: Cigarettes    Quit date: 07/28/2015  . Smokeless tobacco: Never Used  . Alcohol use No    Review of Systems Patient denies headaches, rhinorrhea, blurry vision, numbness, shortness of breath, chest pain, edema, cough, abdominal pain, nausea, vomiting, diarrhea, dysuria, fevers, rashes or hallucinations unless otherwise stated above in HPI. ____________________________________________   PHYSICAL EXAM:  VITAL SIGNS: Vitals:   07/30/16  1655  BP: (!) 151/78  Pulse: (!) 104  Resp: 16  Temp: 98.5 F (36.9 C)    Constitutional: Alert and oriented. Well appearing and in no acute distress. Eyes: Conjunctivae are normal. PERRL. EOMI. Head: Atraumatic. Nose: No congestion/rhinnorhea. Mouth/Throat: Mucous membranes are moist.  Oropharynx non-erythematous. Neck: No stridor. Painless ROM. No cervical spine tenderness to palpation Hematological/Lymphatic/Immunilogical: No cervical  lymphadenopathy. Cardiovascular: Normal rate, regular rhythm. Grossly normal heart sounds.  Good peripheral circulation. Respiratory: Normal respiratory effort.  No retractions. Lungs Coarse bilaterally Gastrointestinal: Soft and nontender. No distention. No abdominal bruits. No CVA tenderness. Musculoskeletal: No lower extremity tenderness nor edema.  No joint effusions. Neurologic:  Normal speech and language. No gross focal neurologic deficits are appreciated. No gait instability. Skin:  Skin is warm, dry and intact. No rash noted. Psychiatric: Mood and affect are normal. Speech and behavior are normal.  ____________________________________________   LABS (all labs ordered are listed, but only abnormal results are displayed)  Results for orders placed or performed during the hospital encounter of 07/30/16 (from the past 24 hour(s))  Basic metabolic panel     Status: None   Collection Time: 07/30/16  5:03 PM  Result Value Ref Range   Sodium 138 135 - 145 mmol/L   Potassium 3.7 3.5 - 5.1 mmol/L   Chloride 104 101 - 111 mmol/L   CO2 26 22 - 32 mmol/L   Glucose, Bld 96 65 - 99 mg/dL   BUN 10 6 - 20 mg/dL   Creatinine, Ser 4.09 0.44 - 1.00 mg/dL   Calcium 9.1 8.9 - 81.1 mg/dL   GFR calc non Af Amer >60 >60 mL/min   GFR calc Af Amer >60 >60 mL/min   Anion gap 8 5 - 15  CBC     Status: Abnormal   Collection Time: 07/30/16  5:03 PM  Result Value Ref Range   WBC 6.5 3.6 - 11.0 K/uL   RBC 4.81 3.80 - 5.20 MIL/uL   Hemoglobin 11.3 (L) 12.0 - 16.0 g/dL   HCT 91.4 (L) 78.2 - 95.6 %   MCV 71.9 (L) 80.0 - 100.0 fL   MCH 23.5 (L) 26.0 - 34.0 pg   MCHC 32.6 32.0 - 36.0 g/dL   RDW 21.3 (H) 08.6 - 57.8 %   Platelets 221 150 - 440 K/uL  Troponin I     Status: None   Collection Time: 07/30/16  5:03 PM  Result Value Ref Range   Troponin I <0.03 <0.03 ng/mL   ____________________________________________  EKG My review and personal interpretation at Time: 16:59   Indication: chest pain   Rate: 95  Rhythm: sinus Axis: normal Other: normal intervals, no st elevations or depressions ____________________________________________  RADIOLOGY  I personally reviewed all radiographic images ordered to evaluate for the above acute complaints and reviewed radiology reports and findings.  These findings were personally discussed with the patient.  Please see medical record for radiology report.  ____________________________________________   PROCEDURES  Procedure(s) performed:  Procedures    Critical Care performed: no ____________________________________________   INITIAL IMPRESSION / ASSESSMENT AND PLAN / ED COURSE  Pertinent labs & imaging results that were available during my care of the patient were reviewed by me and considered in my medical decision making (see chart for details).  DDX: uri, flu, pna, bronchitis, pna, acs, pericarditis, pleurisy  Shirley Stewart is a 36 y.o. who presents to the ED with multiple complaints as described above. She is afebrile and otherwise hemodynamically stable. EKG shows  no evidence of ischemic changes and her troponin is negative after 4 days of discomfort. She has multiple other complaints including including itching eyes and bilateral ear pain. Do not feel this is consistent with a pulmonary embolism. No evidence of pneumonia. Her abdominal exam is soft and benign. Do suspect some component of bronchitis but rather likely symptoms are exacerbated by seasonal allergies. Do not feel the patient requires further diagnostic testing at this point. I do feel patient is stable for close follow-up with her primary care physician. I discussed signs and symptoms for which she should return immediately to the hospital.  Have discussed with the patient and available family all diagnostics and treatments performed thus far and all questions were answered to the best of my ability. The patient demonstrates understanding and agreement with plan.        ____________________________________________   FINAL CLINICAL IMPRESSION(S) / ED DIAGNOSES  Final diagnoses:  Chest pain, unspecified type  Abdominal discomfort, epigastric  Irritation of eye  Sore throat      NEW MEDICATIONS STARTED DURING THIS VISIT:  New Prescriptions   No medications on file     Note:  This document was prepared using Dragon voice recognition software and may include unintentional dictation errors.    Willy Eddyobinson, Saskia Simerson, MD 07/30/16 [id number]

## 2016-07-30 NOTE — ED Triage Notes (Signed)
Pt reports that epigastric chest pain started 4 days ago and then she started with a sore throat, stomach ache, and bilat ear pain - denies vomiting - reports nausea

## 2016-10-02 ENCOUNTER — Emergency Department: Payer: Managed Care, Other (non HMO)

## 2016-10-02 ENCOUNTER — Emergency Department
Admission: EM | Admit: 2016-10-02 | Discharge: 2016-10-02 | Disposition: A | Discharge status: Home / Self Care | Payer: Managed Care, Other (non HMO) | Attending: Emergency Medicine | Admitting: Emergency Medicine

## 2016-10-02 ENCOUNTER — Encounter: Payer: Self-pay | Admitting: Intensive Care

## 2016-10-02 DIAGNOSIS — G8929 Other chronic pain: Secondary | ICD-10-CM | POA: Insufficient documentation

## 2016-10-02 DIAGNOSIS — Z87891 Personal history of nicotine dependence: Secondary | ICD-10-CM | POA: Diagnosis not present

## 2016-10-02 DIAGNOSIS — M25561 Pain in right knee: Secondary | ICD-10-CM | POA: Insufficient documentation

## 2016-10-02 DIAGNOSIS — I10 Essential (primary) hypertension: Secondary | ICD-10-CM | POA: Diagnosis not present

## 2016-10-02 DIAGNOSIS — Z79899 Other long term (current) drug therapy: Secondary | ICD-10-CM | POA: Insufficient documentation

## 2016-10-02 DIAGNOSIS — D509 Iron deficiency anemia, unspecified: Secondary | ICD-10-CM | POA: Insufficient documentation

## 2016-10-02 DIAGNOSIS — R079 Chest pain, unspecified: Secondary | ICD-10-CM | POA: Insufficient documentation

## 2016-10-02 DIAGNOSIS — R0602 Shortness of breath: Secondary | ICD-10-CM | POA: Insufficient documentation

## 2016-10-02 LAB — BASIC METABOLIC PANEL
Anion gap: 7 (ref 5–15)
BUN: 5 mg/dL — ABNORMAL LOW (ref 6–20)
CALCIUM: 9.3 mg/dL (ref 8.9–10.3)
CO2: 25 mmol/L (ref 22–32)
CREATININE: 0.86 mg/dL (ref 0.44–1.00)
Chloride: 106 mmol/L (ref 101–111)
Glucose, Bld: 102 mg/dL — ABNORMAL HIGH (ref 65–99)
Potassium: 3.9 mmol/L (ref 3.5–5.1)
Sodium: 138 mmol/L (ref 135–145)

## 2016-10-02 LAB — TROPONIN I: Troponin I: <0.03 ng/mL (ref <0.03)

## 2016-10-02 LAB — CBC
HCT: 33.9 % — ABNORMAL LOW (ref 35.0–47.0)
Hemoglobin: 11.1 g/dL — ABNORMAL LOW (ref 12.0–16.0)
MCH: 23.7 pg — ABNORMAL LOW (ref 26.0–34.0)
MCHC: 32.7 g/dL (ref 32.0–36.0)
MCV: 72.3 fL — ABNORMAL LOW (ref 80.0–100.0)
PLATELETS: 227 10*3/uL (ref 150–440)
RBC: 4.69 MIL/uL (ref 3.80–5.20)
RDW: 18.3 % — AB (ref 11.5–14.5)
WBC: 5.8 10*3/uL (ref 3.6–11.0)

## 2016-10-02 MED ORDER — HYDROCHLOROTHIAZIDE 12.5 MG PO TABS: 12.5000 mg | ORAL_TABLET | Freq: Every day | ORAL | 0 refills | Status: DC

## 2016-10-02 MED ORDER — NAPROXEN 500 MG PO TABS: 500.0000 mg | ORAL_TABLET | Freq: Two times a day (BID) | ORAL | 0 refills | Status: DC

## 2016-10-02 MED ORDER — HYDROCODONE-ACETAMINOPHEN 5-325 MG PO TABS: 1.0000 | ORAL_TABLET | ORAL | 0 refills | Status: DC | PRN

## 2016-10-02 NOTE — ED Provider Notes (Signed)
[facility] Emergency Department Provider Note   ____________________________________________   First MD Initiated Contact with Patient 10/02/16 1725     (approximate)  I have reviewed the triage vital signs and the nursing notes.   HISTORY  Chief Complaint Knee Pain (right)    HPI Shirley Stewart is a 36 y.o. female comes to the ED complaining of right knee pain that has been going on for approximately 2-3 months. Patient states that for the last 2 nights she has had increased pain after she felt something "pop" while walking down some steps. Patient states this did not cause her to fall. She also reports intermittent chest tightness for the last 3 months that has not been seen by a physician. Patient is a former smoker. She denies any previous cardiac history. She denies any current chest pain at this time. Currently she rates her knee pain as a 9/10.   Past Medical History:  Diagnosis Date  . GERD (gastroesophageal reflux disease)   . Hypertension     There are no active problems to display for this patient.   Past Surgical History:  Procedure Laterality Date  . TUBAL LIGATION      Prior to Admission medications   Medication Sig Start Date End Date Taking? Authorizing Provider  albuterol (PROVENTIL HFA;VENTOLIN HFA) 108 (90 Base) MCG/ACT inhaler Inhale 2 puffs into the lungs every 6 (six) hours as needed for wheezing or shortness of breath. 07/30/16   Willy Eddyobinson, Patrick, MD  aluminum-magnesium hydroxide-simethicone (MAALOX) 200-200-20 MG/5ML SUSP Take 30 mLs by mouth 4 (four) times daily -  before meals and at bedtime. 09/27/15   Sharman CheekStafford, Phillip, MD  famotidine (PEPCID) 20 MG tablet Take 1 tablet (20 mg total) by mouth 2 (two) times daily. 09/27/15   Sharman CheekStafford, Phillip, MD  fluticasone [facility](FLONASE) 50 MCG/ACT nasal spray Place 1 spray into both nostrils 2 (two) times daily. 01/26/15   Cuthriell, Delorise RoyalsJonathan D, PA-C  hydrochlorothiazide (HYDRODIURIL) 12.5 MG  tablet Take 1 tablet (12.5 mg total) by mouth daily. 10/02/16   Tommi RumpsSummers, Marilynne Dupuis L, PA-C  HYDROcodone-acetaminophen (NORCO/VICODIN) 5-325 MG tablet Take 1 tablet by mouth every 4 (four) hours as needed for moderate pain. 10/02/16   Tommi RumpsSummers, Aminta Sakurai L, PA-C  ketorolac (ACULAR) 0.5 % ophthalmic solution Place 1 drop into the right eye 4 (four) times daily. 11/05/15   Cuthriell, Delorise RoyalsJonathan D, PA-C  naproxen (NAPROSYN) 500 MG tablet Take 1 tablet (500 mg total) by mouth 2 (two) times daily with a meal. 10/02/16 10/02/17  Tommi RumpsSummers, Murphy Duzan L, PA-C  tetrahydrozoline (VISINE) 0.05 % ophthalmic solution Place 2 drops into both eyes 2 (two) times daily. 07/30/16   Willy Eddyobinson, Patrick, MD    Allergies Patient has no known allergies.  History reviewed. No pertinent family history.  Social History Social History  Substance Use Topics  . Smoking status: Former Smoker    Packs/day: 1.00    Types: Cigarettes    Quit date: 07/28/2015  . Smokeless tobacco: Never Used  . Alcohol use No    Review of Systems Constitutional: No fever/chills Cardiovascular: Positive intermittent chest pain for 3 months. Respiratory: Denies shortness of breath. Gastrointestinal:   No nausea, no vomiting.  Musculoskeletal: Positive right knee pain. Skin: Negative for rash. Neurological: Negative for headaches, focal weakness or numbness.   ____________________________________________   PHYSICAL EXAM:  VITAL SIGNS: ED Triage Vitals  Enc Vitals Group     BP 10/02/16 1256 (!) 150/85     Pulse Rate 10/02/16 1256 94  Resp 10/02/16 1256 20     Temp 10/02/16 1257 98.4 F (36.9 C)     Temp Source 10/02/16 1257 Oral     SpO2 10/02/16 1256 99 %     Weight 10/02/16 1256 187 lb (84.8 kg)     Height 10/02/16 1256 5' (1.524 m)     Head Circumference --      Peak Flow --      Pain Score 10/02/16 1308 9     Pain Loc --      Pain Edu? --      Excl. in GC? --     Constitutional: Alert and oriented. Well appearing and in no acute  distress. Eyes: Conjunctivae are normal. PERRL. EOMI. Head: Atraumatic. Neck: No stridor.  Cardiovascular: Normal rate, regular rhythm. Grossly normal heart sounds.  Good peripheral circulation. Respiratory: Normal respiratory effort.  No retractions. Lungs CTAB. Gastrointestinal: Soft and nontender. No distention.  Musculoskeletal: Examination of the right knee there is no gross deformity noted. There is no effusion present. No soft tissue swelling can be appreciated. Range of motion is slightly restricted secondary to discomfort. There is no crepitus on exam. Ligaments are stable bilaterally. Neurologic:  Normal speech and language. No gross focal neurologic deficits are appreciated.  Skin:  Skin is warm, dry and intact. No rash noted. Psychiatric: Mood and affect are normal. Speech and behavior are normal.  ____________________________________________   LABS (all labs ordered are listed, but only abnormal results are displayed)  Labs Reviewed  BASIC METABOLIC PANEL - Abnormal; Notable for the following:       Result Value   Glucose, Bld 102 (*)    BUN 5 (*)    All other components within normal limits  CBC - Abnormal; Notable for the following:    Hemoglobin 11.1 (*)    HCT 33.9 (*)    MCV 72.3 (*)    MCH 23.7 (*)    RDW 18.3 (*)    All other components within normal limits  TROPONIN I     RADIOLOGY  Dg Chest 2 View  Result Date: 10/02/2016 CLINICAL DATA:  Shortness of breath, chest pain off and on for 2 months EXAM: CHEST  2 VIEW COMPARISON:  07/30/2016 FINDINGS: Heart and mediastinal contours are within normal limits. No focal opacities or effusions. No acute bony abnormality. IMPRESSION: No active cardiopulmonary disease. Electronically Signed   By: Charlett Nose M.D.   On: 10/02/2016 13:46   Dg Knee 2 Views Right  Result Date: 10/02/2016 CLINICAL DATA:  Right knee pain for 1 year EXAM: RIGHT KNEE - 1-2 VIEW COMPARISON:  None. FINDINGS: No acute fracture. No  dislocation. Mild tricompartment osteoarthritic change. IMPRESSION: No acute bony pathology. Electronically Signed   By: Jolaine Click M.D.   On: 10/02/2016 13:46    ____________________________________________   PROCEDURES  Procedure(s) performed: None  Procedures  Critical Care performed: No  ____________________________________________   INITIAL IMPRESSION / ASSESSMENT AND PLAN / ED COURSE  Pertinent labs & imaging results that were available during my care of the patient were reviewed by me and considered in my medical decision making (see chart for details).  Patient was placed in knee immobilizer and is to follow-up with the orthopedic Department at [facility]. Patient also states that she has been looking for a PCP but has not found anyone locally accepting her insurance. It was suggested that she contact her insurance carrier for a list of doctors names in this area. Patient was also given  a prescription for hydrochlorothiazide 12.5 mg 1 daily for her blood pressure that she has been out of for at least one month. She was given a prescription for naproxen 500 mg twice a day with food and Norco if needed for severe pain. We also discussed over-the-counter iron tablets for her anemia. She is return to the emergency room if any continued chest pain but she was reassured that her chest x-ray and blood work today did not show any evidence of heart problems.      ____________________________________________   FINAL CLINICAL IMPRESSION(S) / ED DIAGNOSES  Final diagnoses:  Chronic pain of right knee  Essential hypertension  Iron deficiency anemia, unspecified iron deficiency anemia type      NEW MEDICATIONS STARTED DURING THIS VISIT:  Discharge Medication List as of 10/02/2016  5:38 PM    START taking these medications   Details  HYDROcodone-acetaminophen (NORCO/VICODIN) 5-325 MG tablet Take 1 tablet by mouth every 4 (four) hours as needed for moderate pain., Starting  Wed 10/02/2016, Print         Note:  This document was prepared using Dragon voice recognition software and may include unintentional dictation errors.    Tommi Rumps, PA-C 10/02/16 1839    Arnaldo Natal, MD 10/03/16 2351

## 2016-10-02 NOTE — ED Notes (Signed)
Discussed discharge instructions, prescriptions, and follow-up care with patient. No questions or concerns at this time. Pt stable at discharge.  

## 2016-10-02 NOTE — Discharge Instructions (Signed)
Wear knee immobilizer for support when walking to help your knee pain. Norco if needed for severe pain. Follow-up with Dr. Joice LoftsPoggi if any continued problems with your knee. Begin taking naproxen 500 mg twice a day with food. Obtain over-the-counter iron tablets to help with your anemia. Begin taking your blood pressure medication as directed daily. Look on line for  doctor's excepting [employer] insurance in this area.

## 2016-10-02 NOTE — ED Triage Notes (Addendum)
Patient c/o R knee pain since two nights ago and she felt something "pop" while walking down stairs. Reports on and off chest tightness X 3 months that comes and goes. EKG completed in triage (Normal EKG) NAD noted in triage

## 2017-04-23 ENCOUNTER — Emergency Department: Payer: 59

## 2017-04-23 ENCOUNTER — Other Ambulatory Visit: Payer: Self-pay

## 2017-04-23 DIAGNOSIS — Z87891 Personal history of nicotine dependence: Secondary | ICD-10-CM | POA: Diagnosis not present

## 2017-04-23 DIAGNOSIS — I10 Essential (primary) hypertension: Secondary | ICD-10-CM | POA: Insufficient documentation

## 2017-04-23 DIAGNOSIS — R0789 Other chest pain: Secondary | ICD-10-CM | POA: Diagnosis present

## 2017-04-23 DIAGNOSIS — Z79899 Other long term (current) drug therapy: Secondary | ICD-10-CM | POA: Insufficient documentation

## 2017-04-23 LAB — BASIC METABOLIC PANEL
Anion gap: 11 (ref 5–15)
BUN: 11 mg/dL (ref 6–20)
CHLORIDE: 103 mmol/L (ref 101–111)
CO2: 25 mmol/L (ref 22–32)
CREATININE: 0.93 mg/dL (ref 0.44–1.00)
Calcium: 9.1 mg/dL (ref 8.9–10.3)
GFR calc non Af Amer: >60 mL/min (ref >60)
Glucose, Bld: 108 mg/dL — ABNORMAL HIGH (ref 65–99)
POTASSIUM: 3.2 mmol/L — AB (ref 3.5–5.1)
Sodium: 139 mmol/L (ref 135–145)

## 2017-04-23 LAB — CBC
HEMATOCRIT: 39.2 % (ref 35.0–47.0)
Hemoglobin: 12.7 g/dL (ref 12.0–16.0)
MCH: 26.3 pg (ref 26.0–34.0)
MCHC: 32.4 g/dL (ref 32.0–36.0)
MCV: 81.2 fL (ref 80.0–100.0)
Platelets: 237 10*3/uL (ref 150–440)
RBC: 4.83 MIL/uL (ref 3.80–5.20)
RDW: 14.5 % (ref 11.5–14.5)
WBC: 8.3 10*3/uL (ref 3.6–11.0)

## 2017-04-23 LAB — TROPONIN I: Troponin I: <0.03 ng/mL (ref <0.03)

## 2017-04-23 NOTE — ED Triage Notes (Signed)
Pt c/o chest pain - she states that her heart is racing and this occurred yesterday as well - today she is having sharp/stabbing pain behind right breast bone that radiates into shoulder - denies N?V - denies dizziness/headache

## 2017-04-24 ENCOUNTER — Emergency Department
Admission: EM | Admit: 2017-04-24 | Discharge: 2017-04-24 | Disposition: A | Discharge status: Home / Self Care | Payer: 59 | Attending: Emergency Medicine | Admitting: Emergency Medicine

## 2017-04-24 DIAGNOSIS — R0789 Other chest pain: Secondary | ICD-10-CM

## 2017-04-24 LAB — TROPONIN I: Troponin I: <0.03 ng/mL (ref <0.03)

## 2017-04-24 LAB — FIBRIN DERIVATIVES D-DIMER (ARMC ONLY): Fibrin derivatives D-dimer (ARMC): 273.82 ng/mL (FEU) (ref 0.00–499.00)

## 2017-04-24 MED ORDER — KETOROLAC TROMETHAMINE 30 MG/ML IJ SOLN
15.0000 mg | Freq: Once | INTRAMUSCULAR | Status: AC
  Administered 2017-04-24: 15 mg via INTRAVENOUS
  Filled 2017-04-24: qty 1

## 2017-04-24 MED ORDER — HYDROCHLOROTHIAZIDE 12.5 MG PO TABS: 25.0000 mg | ORAL_TABLET | Freq: Every day | ORAL | 0 refills | Status: DC

## 2017-04-24 NOTE — ED Provider Notes (Signed)
[facility] Emergency Department Provider Note  ____________________________________________   First MD Initiated Contact with Patient 04/24/17 0201     (approximate)  I have reviewed the triage vital signs and the nursing notes.   HISTORY  Chief Complaint Chest Pain   HPI Shirley Stewart is a 37 y.o. female who comes to the emergency department with right sided sharp sudden onset intermittent chest pain associated with mild shortness of breath.  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  During the shift she noted the chest pain and shortness of breath which prompted the visit.  She does not take oral contraceptives.  She has had no recent surgery travel or immobilization.  She has no history of DVT or pulmonary embolism.  No hemoptysis.  Her symptoms are not pleuritic.  They are nonexertional.  Nothing in particular seems to make them better or worse.  They seem to radiate from her right upper chest to her right shoulder.  Past Medical History:  Diagnosis Date  . GERD (gastroesophageal reflux disease)   . Hypertension     There are no active problems to display for this patient.   Past Surgical History:  Procedure Laterality Date  . TUBAL LIGATION      Prior to Admission medications   Medication Sig Start Date End Date Taking? Authorizing Provider  albuterol (PROVENTIL HFA;VENTOLIN HFA) 108 (90 Base) MCG/ACT inhaler Inhale 2 puffs into the lungs every 6 (six) hours as needed for wheezing or shortness of breath. 07/30/16   Willy Eddy, MD  aluminum-magnesium hydroxide-simethicone (MAALOX) 200-200-20 MG/5ML SUSP Take 30 mLs by mouth 4 (four) times daily -  before meals and at bedtime. 09/27/15   Sharman Cheek, MD  famotidine (PEPCID) 20 MG tablet Take 1 tablet (20 mg total) by mouth 2 (two) times daily. 09/27/15   Sharman Cheek, MD  fluticasone [facility]) 50 MCG/ACT nasal spray Place 1 spray into both nostrils 2  (two) times daily. 01/26/15   Cuthriell, Delorise Royals, PA-C  hydrochlorothiazide (HYDRODIURIL) 12.5 MG tablet Take 2 tablets (25 mg total) by mouth daily. 04/24/17   Merrily Brittle, MD  HYDROcodone-acetaminophen (NORCO/VICODIN) 5-325 MG tablet Take 1 tablet by mouth every 4 (four) hours as needed for moderate pain. 10/02/16   Tommi Rumps, PA-C  ketorolac (ACULAR) 0.5 % ophthalmic solution Place 1 drop into the right eye 4 (four) times daily. 11/05/15   Cuthriell, Delorise Royals, PA-C  naproxen (NAPROSYN) 500 MG tablet Take 1 tablet (500 mg total) by mouth 2 (two) times daily with a meal. 10/02/16 10/02/17  Tommi Rumps, PA-C  tetrahydrozoline (VISINE) 0.05 % ophthalmic solution Place 2 drops into both eyes 2 (two) times daily. 07/30/16   Willy Eddy, MD    Allergies Patient has no known allergies.  No family history on file.  Social History Social History   Tobacco Use  . Smoking status: Former Smoker    Packs/day: 1.00    Types: Cigarettes    Last attempt to quit: 07/28/2015    Years since quitting: 1.7  . Smokeless tobacco: Never Used  Substance Use Topics  . Alcohol use: No  . Drug use: No    Review of Systems Constitutional: No fever/chills Eyes: No visual changes. ENT: No sore throat. Cardiovascular: Positive for chest pain. Respiratory: Positive for shortness of breath. Gastrointestinal: No abdominal pain.  No nausea, no vomiting.  No diarrhea.  No constipation. Genitourinary: Negative for dysuria. Musculoskeletal: Negative for back pain.  Skin: Negative for rash. Neurological: Negative for headaches, focal weakness or numbness.   ____________________________________________   PHYSICAL EXAM:  VITAL SIGNS: ED Triage Vitals  Enc Vitals Group     BP 04/23/17 2122 (!) 157/97     Pulse Rate 04/23/17 2122 87     Resp 04/23/17 2122 15     Temp 04/23/17 2122 98.9 F (37.2 C)     Temp Source 04/23/17 2122 Oral     SpO2 04/23/17 2122 100 %     Weight 04/23/17 2120  204 lb (92.5 kg)     Height 04/23/17 2120 5' (1.524 m)     Head Circumference --      Peak Flow --      Pain Score 04/23/17 2120 6     Pain Loc --      Pain Edu? --      Excl. in GC? --     Constitutional: Alert and oriented x4 well-appearing nontoxic no diaphoresis speaks in full clear sentences Eyes: PERRL EOMI. Head: Atraumatic. Nose: No congestion/rhinnorhea. Mouth/Throat: No trismus Neck: No stridor.   Cardiovascular: Normal rate, regular rhythm. Grossly normal heart sounds.  Good peripheral circulation.  No focal chest wall tenderness Respiratory: Normal respiratory effort.  No retractions. Lungs CTAB and moving good air Gastrointestinal: Obese soft nontender Musculoskeletal: No lower extremity edema legs are equal in size Neurologic:  Normal speech and language. No gross focal neurologic deficits are appreciated. Skin:  Skin is warm, dry and intact. No rash noted. Psychiatric: Mood and affect are normal. Speech and behavior are normal.    ____________________________________________   DIFFERENTIAL includes but not limited to  Acute coronary syndrome, pulmonary embolism, aortic dissection, myocarditis, pericarditis, musculoskeletal pain, pleurisy ____________________________________________   LABS (all labs ordered are listed, but only abnormal results are displayed)  Labs Reviewed  BASIC METABOLIC PANEL - Abnormal; Notable for the following components:      Result Value   Potassium 3.2 (*)    Glucose, Bld 108 (*)    All other components within normal limits  CBC  TROPONIN I  FIBRIN DERIVATIVES D-DIMER ([facility] ONLY)  TROPONIN I    Lab work reviewed by me shows normal d-dimer no signs of acute ischemia __________________________________________  EKG  ED ECG REPORT I, Merrily Brittle, the attending physician, personally viewed and interpreted this ECG.  Date: 04/24/2017 EKG Time:  Rate: 103 Rhythm: Sinus tachycardia QRS Axis: normal Intervals:  normal ST/T Wave abnormalities: normal Narrative Interpretation: no evidence of acute ischemia  ____________________________________________  RADIOLOGY  Chest x-ray reviewed by me with no acute disease ____________________________________________   PROCEDURES  Procedure(s) performed: no  Procedures  Critical Care performed: no  Observation: no ____________________________________________   INITIAL IMPRESSION / ASSESSMENT AND PLAN / ED COURSE  Pertinent labs & imaging results that were available during my care of the patient were reviewed by me and considered in my medical decision making (see chart for details).  By the time I saw the patient she had had a single troponin and chest x-ray and EKG which were unremarkable.  She does have right-sided sharp chest pain with shortness of breath.  She had a heart rate above 100 so she is not PERC negative. I discussed outpatient management versus a d-dimer today and she would prefer a d-dimer which I think is reasonable.  Second troponin is pending.  Fortunately the patient's d-dimer and second troponin are negative.  I had a lengthy discussion with the patient regarding the diagnostic uncertainty and the  need for follow-up with her primary care this coming week for reevaluation.  She is requesting a refill of her hydrochlorothiazide which I think is reasonable.  Discharged home in improved condition and she verbalizes understanding and agreement with the plan.      ____________________________________________   FINAL CLINICAL IMPRESSION(S) / ED DIAGNOSES  Final diagnoses:  Atypical chest pain      NEW MEDICATIONS STARTED DURING THIS VISIT:  Discharge Medication List as of 04/24/2017  3:03 AM       Note:  This document was prepared using Dragon voice recognition software and may include unintentional dictation errors.     Merrily Brittleifenbark, Cheston Coury, MD 04/24/17 [phone]

## 2017-04-24 NOTE — Discharge Instructions (Signed)
Fortunately today your blood work, your chest x-ray, and your EKG were reassuring.  Please follow-up with your primary care physician for reevaluation and return to the emergency department for any concerns.  It was a pleasure to take care of you today, and thank you for coming to our emergency department.  If you have any questions or concerns before leaving please ask the nurse to grab me and I'm more than happy to go through your aftercare instructions again.  If you were prescribed any opioid pain medication today such as Norco, Vicodin, Percocet, morphine, hydrocodone, or oxycodone please make sure you do not drive when you are taking this medication as it can alter your ability to drive safely.  If you have any concerns once you are home that you are not improving or are in fact getting worse before you can make it to your follow-up appointment, please do not hesitate to call 911 and come back for further evaluation.  Merrily BrittleNeil Amilyah Nack, MD  Results for orders placed or performed during the hospital encounter of 04/24/17  Basic metabolic panel  Result Value Ref Range   Sodium 139 135 - 145 mmol/L   Potassium 3.2 (L) 3.5 - 5.1 mmol/L   Chloride 103 101 - 111 mmol/L   CO2 25 22 - 32 mmol/L   Glucose, Bld 108 (H) 65 - 99 mg/dL   BUN 11 6 - 20 mg/dL   Creatinine, Ser 1.610.93 0.44 - 1.00 mg/dL   Calcium 9.1 8.9 - 09.610.3 mg/dL   GFR calc non Af Amer >60 >60 mL/min   GFR calc Af Amer >60 >60 mL/min   Anion gap 11 5 - 15  CBC  Result Value Ref Range   WBC 8.3 3.6 - 11.0 K/uL   RBC 4.83 3.80 - 5.20 MIL/uL   Hemoglobin 12.7 12.0 - 16.0 g/dL   HCT 04.539.2 40.935.0 - 81.147.0 %   MCV 81.2 80.0 - 100.0 fL   MCH 26.3 26.0 - 34.0 pg   MCHC 32.4 32.0 - 36.0 g/dL   RDW 91.414.5 78.211.5 - 95.614.5 %   Platelets 237 150 - 440 K/uL  Troponin I  Result Value Ref Range   Troponin I <0.03 <0.03 ng/mL  Fibrin derivatives D-Dimer  Result Value Ref Range   Fibrin derivatives D-dimer ([facility]) 273.82 0.00 - 499.00 ng/mL (FEU)    Troponin I  Result Value Ref Range   Troponin I <0.03 <0.03 ng/mL   Dg Chest 2 View  Result Date: 04/23/2017 CLINICAL DATA:  Chest pain EXAM: CHEST  2 VIEW COMPARISON:  10/02/2016 FINDINGS: The heart size and mediastinal contours are within normal limits. Both lungs are clear. The visualized skeletal structures are unremarkable. IMPRESSION: No active cardiopulmonary disease. Electronically Signed   By: Alcide CleverMark  Lukens M.D.   On: 04/23/2017 21:43

## 2017-09-20 ENCOUNTER — Encounter (HOSPITAL_COMMUNITY): Payer: Self-pay | Admitting: Emergency Medicine

## 2017-09-20 ENCOUNTER — Emergency Department (HOSPITAL_COMMUNITY): Payer: 59

## 2017-09-20 ENCOUNTER — Emergency Department (HOSPITAL_COMMUNITY)
Admission: EM | Admit: 2017-09-20 | Discharge: 2017-09-20 | Disposition: A | Discharge status: Home / Self Care | Payer: 59 | Attending: Emergency Medicine | Admitting: Emergency Medicine

## 2017-09-20 ENCOUNTER — Other Ambulatory Visit: Payer: Self-pay

## 2017-09-20 DIAGNOSIS — Z7982 Long term (current) use of aspirin: Secondary | ICD-10-CM | POA: Diagnosis not present

## 2017-09-20 DIAGNOSIS — R079 Chest pain, unspecified: Secondary | ICD-10-CM | POA: Diagnosis present

## 2017-09-20 DIAGNOSIS — Z79899 Other long term (current) drug therapy: Secondary | ICD-10-CM | POA: Insufficient documentation

## 2017-09-20 DIAGNOSIS — I10 Essential (primary) hypertension: Secondary | ICD-10-CM | POA: Insufficient documentation

## 2017-09-20 DIAGNOSIS — Z87891 Personal history of nicotine dependence: Secondary | ICD-10-CM | POA: Insufficient documentation

## 2017-09-20 LAB — CBC
HCT: 37.5 % (ref 36.0–46.0)
Hemoglobin: 11.7 g/dL — ABNORMAL LOW (ref 12.0–15.0)
MCH: 25.4 pg — ABNORMAL LOW (ref 26.0–34.0)
MCHC: 31.2 g/dL (ref 30.0–36.0)
MCV: 81.5 fL (ref 78.0–100.0)
Platelets: 248 10*3/uL (ref 150–400)
RBC: 4.6 MIL/uL (ref 3.87–5.11)
RDW: 13.9 % (ref 11.5–15.5)
WBC: 7.4 10*3/uL (ref 4.0–10.5)

## 2017-09-20 LAB — I-STAT TROPONIN, ED: Troponin i, poc: 0 ng/mL (ref 0.00–0.08)

## 2017-09-20 LAB — TROPONIN I: Troponin I: <0.03 ng/mL (ref <0.03)

## 2017-09-20 LAB — BASIC METABOLIC PANEL
Anion gap: 7 (ref 5–15)
BUN: 7 mg/dL (ref 6–20)
CO2: 29 mmol/L (ref 22–32)
Calcium: 9.1 mg/dL (ref 8.9–10.3)
Chloride: 105 mmol/L (ref 98–111)
Creatinine, Ser: 1.04 mg/dL — ABNORMAL HIGH (ref 0.44–1.00)
GFR calc Af Amer: >60 mL/min (ref >60)
GFR calc non Af Amer: >60 mL/min (ref >60)
Glucose, Bld: 104 mg/dL — ABNORMAL HIGH (ref 70–99)
Potassium: 3.4 mmol/L — ABNORMAL LOW (ref 3.5–5.1)
Sodium: 141 mmol/L (ref 135–145)

## 2017-09-20 LAB — I-STAT BETA HCG BLOOD, ED (MC, WL, AP ONLY): I-stat hCG, quantitative: <5 m[IU]/mL (ref <5)

## 2017-09-20 MED ORDER — KETOROLAC TROMETHAMINE 15 MG/ML IJ SOLN
15.0000 mg | Freq: Once | INTRAMUSCULAR | Status: DC
  Filled 2017-09-20: qty 1

## 2017-09-20 MED ORDER — FAMOTIDINE IN NACL 20-0.9 MG/50ML-% IV SOLN
20.0000 mg | Freq: Once | INTRAVENOUS | Status: AC
  Administered 2017-09-20: 20 mg via INTRAVENOUS
  Filled 2017-09-20: qty 50

## 2017-09-20 NOTE — ED Notes (Signed)
Patient transported to X-ray 

## 2017-09-20 NOTE — ED Notes (Signed)
Discharge instructions discussed with Pt. Pt verbalized understanding. Pt stable and ambulatory.    

## 2017-09-20 NOTE — ED Triage Notes (Signed)
Pt reports midsternal chest pain, nausea, and right arm tingling that started 2 hours ago. Pt reports that it feels like a burning/stabbing pain and that she took alkaseltzer with no relief.

## 2017-09-22 NOTE — ED Provider Notes (Signed)
[facility] EM[facility]ENC[facility] Provider Note   CSN: [id number] Arrival date & time: 09/20/17  1806     History   Chief Complaint Chief Complaint  Patient presents with  . Chest Pain    HPI Lynnell CatalanJennifer Legault is a 37 y.o. female.  HPI   37 year old female with chest pain.  Onset about 2 hours prior to arrival while at rest.  Describes pain in the center to the right anterior chest.  Feels burning.  It does not radiate.  Tried taking Alka-Seltzer with no improvement.  Denies any other associated symptoms such as dyspnea, palpitations diaphoresis.  Symptoms have been constant since onset without appreciable exacerbating or relieving factors.  She denies any unusual leg pain or swelling.  No fevers or chills.  No cough.  Past Medical History:  Diagnosis Date  . GERD (gastroesophageal reflux disease)   . Hypertension     There are no active problems to display for this patient.   Past Surgical History:  Procedure Laterality Date  . TUBAL LIGATION       OB History    Gravida  8   Para      Term      Preterm      AB  2   Living  4     SAB  2   TAB      Ectopic      Multiple      Live Births               Home Medications    Prior to Admission medications   Medication Sig Start Date End Date Taking? Authorizing Provider  aspirin EC 325 MG tablet Take 650 mg by mouth every 6 (six) hours as needed for mild pain.   Yes [provider]  aspirin-sod bicarb-citric acid (ALKA-SELTZER) 325 MG TBEF tablet Take 325 mg by mouth every 6 (six) hours as needed (indigestion).   Yes [provider]  D3-50 50000 units capsule Take 50,000 Units by mouth once a week. 08/26/17  Yes [provider]  hydrochlorothiazide (HYDRODIURIL) 12.5 MG tablet Take 2 tablets (25 mg total) by mouth daily. 04/24/17  Yes Merrily Brittleifenbark, Neil, MD    Family History No family history on file.  Social History Social History   Tobacco Use  .  Smoking status: Former Smoker    Packs/day: 1.00    Types: Cigarettes    Last attempt to quit: 07/28/2015    Years since quitting: 2.1  . Smokeless tobacco: Never Used  Substance Use Topics  . Alcohol use: No  . Drug use: No     Allergies   Patient has no known allergies.   Review of Systems Review of Systems  All systems reviewed and negative, other than as noted in HPI.  Physical Exam Updated Vital Signs BP 106/73   Pulse 79   Temp 98.9 F (37.2 C) (Oral)   Resp 17   Ht 5' (1.524 m)   Wt 92.1 kg (203 lb)   LMP 08/30/2017 (Within Weeks)   SpO2 100%   BMI 39.65 kg/m   Physical Exam  Constitutional: She appears well-developed and well-nourished. No distress.  HENT:  Head: Normocephalic and atraumatic.  Eyes: Conjunctivae are normal. Right eye exhibits no discharge. Left eye exhibits no discharge.  Neck: Neck supple.  Cardiovascular: Normal rate, regular rhythm and normal heart sounds. Exam reveals no gallop and no friction rub.  No murmur heard. Pulmonary/Chest: Effort normal and breath sounds  normal. No respiratory distress.  Abdominal: Soft. She exhibits no distension. There is no tenderness.  Musculoskeletal: She exhibits no edema or tenderness.  Lower extremities symmetric as compared to each other. No calf tenderness. Negative Homan's. No palpable cords.   Neurological: She is alert.  Skin: Skin is warm and dry.  Psychiatric: She has a normal mood and affect. Her behavior is normal. Thought content normal.  Nursing note and vitals reviewed.    ED Treatments / Results  Labs (all labs ordered are listed, but only abnormal results are displayed) Labs Reviewed  BASIC METABOLIC PANEL - Abnormal; Notable for the following components:      Result Value   Potassium 3.4 (*)    Glucose, Bld 104 (*)    Creatinine, Ser 1.04 (*)    All other components within normal limits  CBC - Abnormal; Notable for the following components:   Hemoglobin 11.7 (*)    MCH 25.4  (*)    All other components within normal limits  TROPONIN I  I-STAT TROPONIN, ED  I-STAT BETA HCG BLOOD, ED ([facility], [facility], [facility] ONLY)    EKG EKG Interpretation  Date/Time:  Saturday September 20 2017 18:09:31 EDT Ventricular Rate:  91 PR Interval:  128 QRS Duration: 92 QT Interval:  322 QTC Calculation: 396 R Axis:   81 Text Interpretation:  Normal sinus rhythm Nonspecific ST and T wave abnormality Abnormal ECG Confirmed by Raeford Razor [phone]) on 09/20/2017 7:00:28 PM Also confirmed by Raeford Razor [phone]), editor Sheppard Evens ([id number])  on 09/21/2017 12:47:49 PM   Radiology No results found.   Dg Chest 2 View  Result Date: 09/20/2017 CLINICAL DATA:  Chest pain. EXAM: CHEST - 2 VIEW COMPARISON:  Chest x-ray dated April 23, 2017. FINDINGS: The heart size and mediastinal contours are within normal limits. Normal pulmonary vascularity. No focal consolidation, pleural effusion, or pneumothorax. Left nipple shadow. No acute osseous abnormality. IMPRESSION: No active cardiopulmonary disease. Electronically Signed   By: Obie Dredge M.D.   On: 09/20/2017 19:27    Procedures Procedures (including critical care time)  Medications Ordered in ED Medications  famotidine (PEPCID) IVPB 20 mg premix (0 mg Intravenous Stopped 09/20/17 2201)     Initial Impression / Assessment and Plan / ED Course  I have reviewed the triage vital signs and the nursing notes.  Pertinent labs & imaging results that were available during my care of the patient were reviewed by me and considered in my medical decision making (see chart for details).     37 year old female with chest pain.  Seems atypical for ACS.  Doubt PE, dissection or other emergent process.  Plan systematic treatment with NSAIDs at this point.  She has a history of reflux but symptoms atypical for this and not typical for her.  Return precautions were discussed.  Final Clinical Impressions(s) / ED Diagnoses   Final diagnoses:  Chest  pain, unspecified type    ED Discharge Orders    None       Raeford Razor, MD 09/22/17 2150

## 2017-09-26 ENCOUNTER — Emergency Department
Admission: EM | Admit: 2017-09-26 | Discharge: 2017-09-26 | Disposition: A | Discharge status: Home / Self Care | Payer: 59 | Attending: Emergency Medicine | Admitting: Emergency Medicine

## 2017-09-26 ENCOUNTER — Encounter: Payer: Self-pay | Admitting: Emergency Medicine

## 2017-09-26 ENCOUNTER — Emergency Department: Payer: 59

## 2017-09-26 ENCOUNTER — Other Ambulatory Visit: Payer: Self-pay

## 2017-09-26 DIAGNOSIS — Z5321 Procedure and treatment not carried out due to patient leaving prior to being seen by health care provider: Secondary | ICD-10-CM | POA: Insufficient documentation

## 2017-09-26 DIAGNOSIS — R079 Chest pain, unspecified: Secondary | ICD-10-CM | POA: Insufficient documentation

## 2017-09-26 DIAGNOSIS — R109 Unspecified abdominal pain: Secondary | ICD-10-CM | POA: Insufficient documentation

## 2017-09-26 LAB — URINALYSIS, COMPLETE (UACMP) WITH MICROSCOPIC
BACTERIA UA: NONE SEEN
BILIRUBIN URINE: NEGATIVE
Glucose, UA: NEGATIVE mg/dL
Hgb urine dipstick: NEGATIVE
Ketones, ur: NEGATIVE mg/dL
LEUKOCYTES UA: NEGATIVE
Nitrite: NEGATIVE
PH: 5 (ref 5.0–8.0)
Protein, ur: NEGATIVE mg/dL
SPECIFIC GRAVITY, URINE: 1.018 (ref 1.005–1.030)

## 2017-09-26 LAB — COMPREHENSIVE METABOLIC PANEL
ALBUMIN: 4.3 g/dL (ref 3.5–5.0)
ALT: 18 U/L (ref 0–44)
AST: 18 U/L (ref 15–41)
Alkaline Phosphatase: 75 U/L (ref 38–126)
Anion gap: 11 (ref 5–15)
BUN: 9 mg/dL (ref 6–20)
CO2: 25 mmol/L (ref 22–32)
Calcium: 9.4 mg/dL (ref 8.9–10.3)
Chloride: 103 mmol/L (ref 98–111)
Creatinine, Ser: 0.95 mg/dL (ref 0.44–1.00)
GFR calc Af Amer: >60 mL/min (ref >60)
GFR calc non Af Amer: >60 mL/min (ref >60)
GLUCOSE: 94 mg/dL (ref 70–99)
POTASSIUM: 3.7 mmol/L (ref 3.5–5.1)
Sodium: 139 mmol/L (ref 135–145)
Total Bilirubin: 0.5 mg/dL (ref 0.3–1.2)
Total Protein: 7.6 g/dL (ref 6.5–8.1)

## 2017-09-26 LAB — LIPASE, BLOOD: LIPASE: 24 U/L (ref 11–51)

## 2017-09-26 LAB — CBC
HEMATOCRIT: 36.7 % (ref 35.0–47.0)
HEMOGLOBIN: 12.2 g/dL (ref 12.0–16.0)
MCH: 26.4 pg (ref 26.0–34.0)
MCHC: 33.2 g/dL (ref 32.0–36.0)
MCV: 79.6 fL — ABNORMAL LOW (ref 80.0–100.0)
Platelets: 219 10*3/uL (ref 150–440)
RBC: 4.61 MIL/uL (ref 3.80–5.20)
RDW: 14.6 % — ABNORMAL HIGH (ref 11.5–14.5)
WBC: 8.4 10*3/uL (ref 3.6–11.0)

## 2017-09-26 LAB — POC URINE PREG, ED: PREG TEST UR: NEGATIVE

## 2017-09-26 LAB — TROPONIN I: Troponin I: <0.03 ng/mL (ref <0.03)

## 2017-09-26 NOTE — ED Triage Notes (Signed)
Pt to ED via POV with c/o CP and ABD pain. Pt states was seen for CP xfwe days ago in ED and was sent home with neg results. PT states abd pain started xcouple days . PT denies v/d. PT in NAD at this time.

## 2017-09-29 ENCOUNTER — Telehealth: Payer: Self-pay | Admitting: Emergency Medicine

## 2017-09-29 NOTE — Telephone Encounter (Signed)
Called patient due to lwot to inquire about condition and follow up plans. Left message.   

## 2017-11-10 ENCOUNTER — Encounter: Payer: Self-pay | Admitting: Emergency Medicine

## 2017-11-10 ENCOUNTER — Other Ambulatory Visit: Payer: Self-pay

## 2017-11-10 ENCOUNTER — Emergency Department
Admission: EM | Admit: 2017-11-10 | Discharge: 2017-11-10 | Disposition: A | Discharge status: Home / Self Care | Payer: 59 | Attending: Emergency Medicine | Admitting: Emergency Medicine

## 2017-11-10 ENCOUNTER — Emergency Department: Payer: 59

## 2017-11-10 DIAGNOSIS — I10 Essential (primary) hypertension: Secondary | ICD-10-CM | POA: Diagnosis not present

## 2017-11-10 DIAGNOSIS — Z79899 Other long term (current) drug therapy: Secondary | ICD-10-CM | POA: Insufficient documentation

## 2017-11-10 DIAGNOSIS — Z87891 Personal history of nicotine dependence: Secondary | ICD-10-CM | POA: Insufficient documentation

## 2017-11-10 DIAGNOSIS — R079 Chest pain, unspecified: Secondary | ICD-10-CM | POA: Diagnosis present

## 2017-11-10 DIAGNOSIS — R0789 Other chest pain: Secondary | ICD-10-CM | POA: Insufficient documentation

## 2017-11-10 LAB — BASIC METABOLIC PANEL
Anion gap: 6 (ref 5–15)
BUN: 8 mg/dL (ref 6–20)
CHLORIDE: 101 mmol/L (ref 98–111)
CO2: 30 mmol/L (ref 22–32)
CREATININE: 1.13 mg/dL — AB (ref 0.44–1.00)
Calcium: 9.3 mg/dL (ref 8.9–10.3)
Glucose, Bld: 95 mg/dL (ref 70–99)
POTASSIUM: 3.9 mmol/L (ref 3.5–5.1)
Sodium: 137 mmol/L (ref 135–145)

## 2017-11-10 LAB — CBC
HCT: 37.3 % (ref 35.0–47.0)
Hemoglobin: 12.3 g/dL (ref 12.0–16.0)
MCH: 26.5 pg (ref 26.0–34.0)
MCHC: 32.9 g/dL (ref 32.0–36.0)
MCV: 80.6 fL (ref 80.0–100.0)
PLATELETS: 227 10*3/uL (ref 150–440)
RBC: 4.63 MIL/uL (ref 3.80–5.20)
RDW: 14.8 % — ABNORMAL HIGH (ref 11.5–14.5)
WBC: 7.9 10*3/uL (ref 3.6–11.0)

## 2017-11-10 LAB — TROPONIN I: Troponin I: <0.03 ng/mL (ref <0.03)

## 2017-11-10 NOTE — ED Triage Notes (Signed)
Pt arrived to the ED for complaints of chest pain x2 days. Pt reports that she has been experiencing stabbing chest pain rated at a 6/10 on and off. Pt is AOx4 in no apparent distress denies nausea, vomiting, shortness of breath or any cardiac history.

## 2017-11-10 NOTE — ED Notes (Signed)
First Nurse Note:  Patient states she began to have right sided chest pain this morning.  Patient states pain is somewhat relieved when she holds pressure to her shoulder/chest.

## 2017-11-10 NOTE — ED Provider Notes (Signed)
[facility] Emergency Department Provider Note  ____________________________________________   I have reviewed the triage vital signs and the nursing notes. Where available I have reviewed prior notes and, if possible and indicated, outside hospital notes.    HISTORY  Chief Complaint Chest Pain    HPI Shirley Stewart is a 37 y.o. female  With a history of noncardiac chest pain for many years, with multiple visits to multiple ERs for it, negative stress test, no history of PE or DVT in herself and her family no recent travels not on birth control, no leg swelling, no shortness of breath, presents today with right-sided chest wall pain.  She sleeps on that side, and she also does heavy lifting with patients.  She picked up a heavy patient noted afterwards that she had some discomfort in the right chest wall periods worse when she touches it or lays on it the wrong way.  There is no shortness of breath is not exertional, she has had no pleuritic pain, it is a sharp nonradiating discomfort that she can very specifically identify as being in the region of the pectoralis muscle on the right side.  Is worse when she does heavy lifting using that arm.  Nothing else makes it better.  Took Aleve at home which seem to help but it never completely went away.     Past Medical History:  Diagnosis Date  . GERD (gastroesophageal reflux disease)   . Hypertension     There are no active problems to display for this patient.   Past Surgical History:  Procedure Laterality Date  . TUBAL LIGATION      Prior to Admission medications   Medication Sig Start Date End Date Taking? Authorizing Provider  aspirin EC 325 MG tablet Take 650 mg by mouth every 6 (six) hours as needed for mild pain.    [provider]  aspirin-sod bicarb-citric acid (ALKA-SELTZER) 325 MG TBEF tablet Take 325 mg by mouth every 6 (six) hours as needed (indigestion).    [provider]   D3-50 50000 units capsule Take 50,000 Units by mouth once a week. 08/26/17   [provider]  hydrochlorothiazide (HYDRODIURIL) 12.5 MG tablet Take 2 tablets (25 mg total) by mouth daily. 04/24/17   Merrily Brittleifenbark, Neil, MD    Allergies Patient has no known allergies.  History reviewed. No pertinent family history.  Social History Social History   Tobacco Use  . Smoking status: Former Smoker    Packs/day: 1.00    Types: Cigarettes    Last attempt to quit: 07/28/2015    Years since quitting: 2.2  . Smokeless tobacco: Never Used  Substance Use Topics  . Alcohol use: No  . Drug use: No    Review of Systems Constitutional: No fever/chills Eyes: No visual changes. ENT: No sore throat. No stiff neck no neck pain Cardiovascular: See HPI chest pain. Respiratory: Denies shortness of breath. Gastrointestinal:   no vomiting.  No diarrhea.  No constipation. Genitourinary: Negative for dysuria. Musculoskeletal: Negative lower extremity swelling Skin: Negative for rash. Neurological: Negative for severe headaches, focal weakness or numbness.   ____________________________________________   PHYSICAL EXAM:  VITAL SIGNS: ED Triage Vitals  Enc Vitals Group     BP 11/10/17 2107 (!) 132/92     Pulse Rate 11/10/17 2107 91     Resp 11/10/17 2107 18     Temp 11/10/17 2107 98.1 F (36.7 C)     Temp Source 11/10/17 2107 Oral  SpO2 11/10/17 2107 100 %     Weight 11/10/17 2108 201 lb (91.2 kg)     Height 11/10/17 2108 5\' 1"  (1.549 m)     Head Circumference --      Peak Flow --      Pain Score 11/10/17 2108 6     Pain Loc --      Pain Edu? --      Excl. in GC? --     Constitutional: Alert and oriented. Well appearing and in no acute distress. Eyes: Conjunctivae are normal Head: Atraumatic HEENT: No congestion/rhinnorhea. Mucous membranes are moist.  Oropharynx non-erythematous Neck:   Nontender with no meningismus, no masses, no stridor Cardiovascular: Normal rate, regular  rhythm. Grossly normal heart sounds.  Good peripheral circulation. Chest: Female nurse chaperone present, Erie NoeVanessa, there is tenderness to palpation to the pectoralis muscle region of the right chest above the right breast.  There is no breast mass or lesions noted there is no breast pain, there is no evidence of abscess, there is no flail chest there is no crepitus, there is no rib fracture palpated, when I touch this area patient pulls back and states "ouch that the pain right there". Respiratory: Normal respiratory effort.  No retractions. Lungs CTAB. Abdominal: Soft and nontender. No distention. No guarding no rebound Back:  There is no focal tenderness or step off.  there is no midline tenderness there are no lesions noted. there is no CVA tenderness Musculoskeletal: No lower extremity tenderness, no upper extremity tenderness. No joint effusions, no DVT signs strong distal pulses no edema Neurologic:  Normal speech and language. No gross focal neurologic deficits are appreciated.  Skin:  Skin is warm, dry and intact. No rash noted. Psychiatric: Mood and affect are normal. Speech and behavior are normal.  ____________________________________________   LABS (all labs ordered are listed, but only abnormal results are displayed)  Labs Reviewed  BASIC METABOLIC PANEL - Abnormal; Notable for the following components:      Result Value   Creatinine, Ser 1.13 (*)    All other components within normal limits  CBC - Abnormal; Notable for the following components:   RDW 14.8 (*)    All other components within normal limits  TROPONIN I  POC URINE PREG, ED    Pertinent labs  results that were available during my care of the patient were reviewed by me and considered in my medical decision making (see chart for details). ____________________________________________  EKG  I personally interpreted any EKGs ordered by me or triage Sinus rhythm rate 91 bpm no acute ST elevation or depression,  nonischemic EKG ____________________________________________  RADIOLOGY  Pertinent labs & imaging results that were available during my care of the patient were reviewed by me and considered in my medical decision making (see chart for details). If possible, patient and/or family made aware of any abnormal findings.  Dg Chest 2 View  Result Date: 11/10/2017 CLINICAL DATA:  Chest pain for 2 days. EXAM: CHEST - 2 VIEW COMPARISON:  Radiograph 09/26/2017, additional priors FINDINGS: The cardiomediastinal contours are normal. The lungs are clear. Pulmonary vasculature is normal. No consolidation, pleural effusion, or pneumothorax. No acute osseous abnormalities are seen. IMPRESSION: Negative radiographs of the chest. Electronically Signed   By: Rubye OaksMelanie  Ehinger M.D.   On: 11/10/2017 21:23   ____________________________________________    PROCEDURES  Procedure(s) performed: None  Procedures  Critical Care performed: None  ____________________________________________   INITIAL IMPRESSION / ASSESSMENT AND PLAN / ED COURSE  Pertinent labs & imaging results that were available during my care of the patient were reviewed by me and considered in my medical decision making (see chart for details).  Reproducible chest wall pain after heavy lifting, at this time, there does not appear to be clinical evidence to support the diagnosis of pulmonary embolus, dissection, myocarditis, endocarditis, pericarditis, pericardial tamponade, acute coronary syndrome, pneumothorax, pneumonia, or any other acute intrathoracic pathology that will require admission or acute intervention. Nor is there evidence of any significant intra-abdominal pathology causing this discomfort.  I do not think patient requires serial enzymes as she is had this pain for 2 days and has had extensive work-up of noncardiac chest pain in the past.  Nothing to suggest PE.  She is PERC negative, and she has no risk factors and this is  reproducible chest wall pain.  Patient very comfortable with our reassurance and will follow-up.  Recommended nonsteroidal pain medication return precautions were given understood    ____________________________________________   FINAL CLINICAL IMPRESSION(S) / ED DIAGNOSES  Final diagnoses:  None      This chart was dictated using voice recognition software.  Despite best efforts to proofread,  errors can occur which can change meaning.      Jeanmarie Plant, MD 11/10/17 2221

## 2017-11-14 ENCOUNTER — Other Ambulatory Visit: Payer: Self-pay

## 2017-11-14 ENCOUNTER — Emergency Department: Payer: 59

## 2017-11-14 DIAGNOSIS — M25512 Pain in left shoulder: Secondary | ICD-10-CM | POA: Insufficient documentation

## 2017-11-14 DIAGNOSIS — R079 Chest pain, unspecified: Secondary | ICD-10-CM | POA: Diagnosis present

## 2017-11-14 DIAGNOSIS — Z5321 Procedure and treatment not carried out due to patient leaving prior to being seen by health care provider: Secondary | ICD-10-CM | POA: Diagnosis not present

## 2017-11-14 LAB — BASIC METABOLIC PANEL
Anion gap: 6 (ref 5–15)
BUN: 9 mg/dL (ref 6–20)
CALCIUM: 9.4 mg/dL (ref 8.9–10.3)
CO2: 27 mmol/L (ref 22–32)
CREATININE: 1.11 mg/dL — AB (ref 0.44–1.00)
Chloride: 104 mmol/L (ref 98–111)
GFR calc non Af Amer: >60 mL/min (ref >60)
Glucose, Bld: 92 mg/dL (ref 70–99)
Potassium: 4.1 mmol/L (ref 3.5–5.1)
SODIUM: 137 mmol/L (ref 135–145)

## 2017-11-14 LAB — CBC
HCT: 37.2 % (ref 35.0–47.0)
Hemoglobin: 12.1 g/dL (ref 12.0–16.0)
MCH: 26.4 pg (ref 26.0–34.0)
MCHC: 32.6 g/dL (ref 32.0–36.0)
MCV: 81.1 fL (ref 80.0–100.0)
PLATELETS: 226 10*3/uL (ref 150–440)
RBC: 4.59 MIL/uL (ref 3.80–5.20)
RDW: 14.7 % — ABNORMAL HIGH (ref 11.5–14.5)
WBC: 10.5 10*3/uL (ref 3.6–11.0)

## 2017-11-14 LAB — TROPONIN I

## 2017-11-14 LAB — POCT PREGNANCY, URINE: Preg Test, Ur: NEGATIVE

## 2017-11-14 NOTE — ED Triage Notes (Signed)
Pt arrives to ED via POV with c/o chest pain "that won't let up". Pt reports s/x's started around 3pm today while at work; pt describes left-sided chest pain with radiation into the left shoulder. Pt denies SHOB, no N/V/D or fever. Pt is A&O, in NAD; RR even, regular, and unlabored.

## 2017-11-15 ENCOUNTER — Emergency Department
Admission: EM | Admit: 2017-11-15 | Discharge: 2017-11-15 | Disposition: A | Discharge status: Home / Self Care | Payer: 59 | Attending: Emergency Medicine | Admitting: Emergency Medicine

## 2017-11-17 ENCOUNTER — Emergency Department: Payer: 59

## 2017-11-17 ENCOUNTER — Telehealth: Payer: Self-pay | Admitting: Emergency Medicine

## 2017-11-17 ENCOUNTER — Encounter: Payer: Self-pay | Admitting: Emergency Medicine

## 2017-11-17 ENCOUNTER — Other Ambulatory Visit: Payer: Self-pay

## 2017-11-17 DIAGNOSIS — Z87891 Personal history of nicotine dependence: Secondary | ICD-10-CM | POA: Insufficient documentation

## 2017-11-17 DIAGNOSIS — I10 Essential (primary) hypertension: Secondary | ICD-10-CM | POA: Insufficient documentation

## 2017-11-17 DIAGNOSIS — Z7982 Long term (current) use of aspirin: Secondary | ICD-10-CM | POA: Insufficient documentation

## 2017-11-17 DIAGNOSIS — R0602 Shortness of breath: Secondary | ICD-10-CM | POA: Insufficient documentation

## 2017-11-17 NOTE — Telephone Encounter (Signed)
Called patient due to lwot to inquire about condition and follow up plans. Left message.   

## 2017-11-17 NOTE — ED Triage Notes (Signed)
Pt arrived to the ED accompanied by her family for complaints of shortness of breath. Pt states [place] suddenly it was difficult to breath. Pt reports that she has not been feeling well lately with multiple visits to the ED for chest tightness without any diagnosis. Pt is AOx4 in no apparent distress. Pt is anxious during triage.

## 2017-11-18 ENCOUNTER — Other Ambulatory Visit: Payer: Self-pay

## 2017-11-18 ENCOUNTER — Emergency Department: Payer: 59

## 2017-11-18 ENCOUNTER — Emergency Department
Admission: EM | Admit: 2017-11-18 | Discharge: 2017-11-18 | Disposition: A | Discharge status: Home / Self Care | Payer: 59 | Attending: Emergency Medicine | Admitting: Emergency Medicine

## 2017-11-18 ENCOUNTER — Encounter: Payer: Self-pay | Admitting: Radiology

## 2017-11-18 DIAGNOSIS — R0602 Shortness of breath: Secondary | ICD-10-CM

## 2017-11-18 LAB — CBC
HCT: 38.2 % (ref 35.0–47.0)
Hemoglobin: 12.6 g/dL (ref 12.0–16.0)
MCH: 26.7 pg (ref 26.0–34.0)
MCHC: 33 g/dL (ref 32.0–36.0)
MCV: 81 fL (ref 80.0–100.0)
PLATELETS: 242 10*3/uL (ref 150–440)
RBC: 4.72 MIL/uL (ref 3.80–5.20)
RDW: 15 % — ABNORMAL HIGH (ref 11.5–14.5)
WBC: 9.9 10*3/uL (ref 3.6–11.0)

## 2017-11-18 LAB — COMPREHENSIVE METABOLIC PANEL
ALT: 24 U/L (ref 0–44)
AST: 23 U/L (ref 15–41)
Albumin: 4.3 g/dL (ref 3.5–5.0)
Alkaline Phosphatase: 77 U/L (ref 38–126)
Anion gap: 6 (ref 5–15)
BUN: 10 mg/dL (ref 6–20)
CHLORIDE: 104 mmol/L (ref 98–111)
CO2: 27 mmol/L (ref 22–32)
Calcium: 9.1 mg/dL (ref 8.9–10.3)
Creatinine, Ser: 1.02 mg/dL — ABNORMAL HIGH (ref 0.44–1.00)
Glucose, Bld: 110 mg/dL — ABNORMAL HIGH (ref 70–99)
POTASSIUM: 3.7 mmol/L (ref 3.5–5.1)
SODIUM: 137 mmol/L (ref 135–145)
Total Bilirubin: 0.6 mg/dL (ref 0.3–1.2)
Total Protein: 7.8 g/dL (ref 6.5–8.1)

## 2017-11-18 LAB — POCT PREGNANCY, URINE: PREG TEST UR: NEGATIVE

## 2017-11-18 LAB — SEDIMENTATION RATE: SED RATE: 11 mm/h (ref 0–20)

## 2017-11-18 MED ORDER — IOPAMIDOL (ISOVUE-370) INJECTION 76%
75.0000 mL | Freq: Once | INTRAVENOUS | Status: AC | PRN
  Administered 2017-11-18: 75 mL via INTRAVENOUS

## 2017-11-18 MED ORDER — ALUM & MAG HYDROXIDE-SIMETH 200-200-20 MG/5ML PO SUSP
30.0000 mL | Freq: Once | ORAL | Status: AC
Start: 2017-11-18 — End: 2017-11-18
  Administered 2017-11-18: 30 mL via ORAL
  Filled 2017-11-18: qty 30

## 2017-11-18 NOTE — ED Provider Notes (Signed)
[facility] Emergency Department Provider Note   None    (approximate)  I have reviewed the triage vital signs and the nursing notes.   HISTORY  Chief Complaint Shortness of Breath and Cough    HPI Shirley Stewart is a 37 y.o. female with below list of chronic medical conditions presents to the emergency department with central chest discomfort with associated dyspnea.  Patient states that she has had episodes of chest pain in the past without any diagnosis given however patient states today was a first episode of dyspnea associated with the chest discomfort.  Patient denies any lower externally pain or swelling.  Patient denies any history of DVT or PE.  Patient described the midline chest discomfort as: "Feels like if it has been rubbed with sandpaper".  Patient states the discomfort extends from the neck down into the mid chest.   Past Medical History:  Diagnosis Date  . GERD (gastroesophageal reflux disease)   . Hypertension     There are no active problems to display for this patient.   Past Surgical History:  Procedure Laterality Date  . TUBAL LIGATION      Prior to Admission medications   Medication Sig Start Date End Date Taking? Authorizing Provider  aspirin EC 325 MG tablet Take 650 mg by mouth every 6 (six) hours as needed for mild pain.    [provider]  aspirin-sod bicarb-citric acid (ALKA-SELTZER) 325 MG TBEF tablet Take 325 mg by mouth every 6 (six) hours as needed (indigestion).    [provider]  D3-50 50000 units capsule Take 50,000 Units by mouth once a week. 08/26/17   [provider]  hydrochlorothiazide (HYDRODIURIL) 12.5 MG tablet Take 2 tablets (25 mg total) by mouth daily. 04/24/17   Merrily Brittle, MD    Allergies No known drug allergies History reviewed. No pertinent family history.  Social History Social History   Tobacco Use  . Smoking status: Former Smoker    Packs/day: 1.00    Types:  Cigarettes    Last attempt to quit: 07/28/2015    Years since quitting: 2.3  . Smokeless tobacco: Never Used  Substance Use Topics  . Alcohol use: No  . Drug use: No    Review of Systems Constitutional: No fever/chills Eyes: No visual changes. ENT: No sore throat. Cardiovascular: Positive for chest pain. Respiratory: Positive for shortness of breath. Gastrointestinal: No abdominal pain.  No nausea, no vomiting.  No diarrhea.  No constipation. Genitourinary: Negative for dysuria. Musculoskeletal: Negative for neck pain.  Negative for back pain. Integumentary: Negative for rash. Neurological: Negative for headaches, focal weakness or numbness.  ____________________________________________   PHYSICAL EXAM:  VITAL SIGNS: ED Triage Vitals  Enc Vitals Group     BP 11/17/17 2344 131/72     Pulse Rate 11/17/17 2344 91     Resp 11/17/17 2344 18     Temp 11/17/17 2344 98.4 F (36.9 C)     Temp Source 11/17/17 2344 Oral     SpO2 11/17/17 2344 100 %     Weight 11/17/17 2345 92 kg (202 lb 13.2 oz)     Height 11/17/17 2345 1.524 m (5')     Head Circumference --      Peak Flow --      Pain Score 11/17/17 2345 0     Pain Loc --      Pain Edu? --      Excl. in GC? --     Constitutional:  Alert and oriented. Well appearing and in no acute distress. Eyes: Conjunctivae are normal.  Head: Atraumatic. Mouth/Throat: Mucous membranes are moist. Oropharynx non-erythematous. Neck: No stridor.   Cardiovascular: Normal rate, regular rhythm. Good peripheral circulation. Grossly normal heart sounds. Respiratory: Normal respiratory effort.  No retractions. Lungs CTAB. Gastrointestinal: Soft and nontender. No distention.  Musculoskeletal: No lower extremity tenderness nor edema. No gross deformities of extremities. Neurologic:  Normal speech and language. No gross focal neurologic deficits are appreciated.  Skin:  Skin is warm, dry and intact. No rash noted. Psychiatric: Mood and affect are  normal. Speech and behavior are normal.  ____________________________________________   LABS (all labs ordered are listed, but only abnormal results are displayed)  Labs Reviewed  CBC - Abnormal; Notable for the following components:      Result Value   RDW 15.0 (*)    All other components within normal limits  COMPREHENSIVE METABOLIC PANEL - Abnormal; Notable for the following components:   Glucose, Bld 110 (*)    Creatinine, Ser 1.02 (*)    All other components within normal limits  SEDIMENTATION RATE  POCT PREGNANCY, URINE   ____________________________________________  EKG  ED ECG REPORT I, [place] Mayli Covington, the attending physician, personally viewed and interpreted this ECG.   Date: 11/18/2017  EKG Time: 2:21 AM  Rate: 87  Rhythm: Sinus rhythm  Axis: Normal  Intervals: Normal  ST&T Change: None  ____________________________________________  RADIOLOGY I, [place] Yara Tomkinson, personally viewed and evaluated these images (plain radiographs) as part of my medical decision making, as well as reviewing the written report by the radiologist.  ED MD interpretation: No active cardiopulmonary chest x-ray findings.  No acute intrathoracic findings on CT chest per radiologist  Official radiology report(s): Dg Chest 2 View  Result Date: 11/18/2017 CLINICAL DATA:  Shortness of breath, cough EXAM: CHEST - 2 VIEW COMPARISON:  11/14/2017 FINDINGS: Heart and mediastinal contours are within normal limits. No focal opacities or effusions. No acute bony abnormality. IMPRESSION: No active cardiopulmonary disease. Electronically Signed   By: Charlett NoseKevin  Dover M.D.   On: 11/18/2017 00:03   Ct Angio Chest Pe W And/or Wo Contrast  Result Date: 11/18/2017 CLINICAL DATA:  37 year old female with shortness of breath and chest pain. EXAM: CT ANGIOGRAPHY CHEST WITH CONTRAST TECHNIQUE: Multidetector CT imaging of the chest was performed using the standard protocol during bolus administration of  intravenous contrast. Multiplanar CT image reconstructions and MIPs were obtained to evaluate the vascular anatomy. CONTRAST:  75mL ISOVUE-370 IOPAMIDOL (ISOVUE-370) INJECTION 76% COMPARISON:  Chest radiograph dated 11/17/2017 FINDINGS: Cardiovascular: There is no cardiomegaly or pericardial effusion. The thoracic aorta is unremarkable. Suboptimal evaluation of the pulmonary arteries due to poor enhancement of the distal branches. No large or central pulmonary artery embolus identified. Mediastinum/Nodes: No hilar or mediastinal adenopathy. Esophagus and the thyroid gland are grossly unremarkable. No mediastinal fluid collection. Lungs/Pleura: Lungs are clear. No pleural effusion or pneumothorax. Upper Abdomen: No acute abnormality. Musculoskeletal: No chest wall abnormality. No acute or significant osseous findings. Review of the MIP images confirms the above findings. IMPRESSION: No acute intrathoracic pathology. No CT evidence of pulmonary embolism. Electronically Signed   By: Elgie CollardArash  Radparvar M.D.   On: 11/18/2017 03:40    ____________________________________________    Procedures   ____________________________________________   INITIAL IMPRESSION / ASSESSMENT AND PLAN / ED COURSE  As part of my medical decision making, I reviewed the following data within the electronic MEDICAL RECORD NUMBER   This-year-old female present with above-stated  history and physical exam secondary to dyspnea and chest discomfort considered possibly of CAD ACS and as such EKG was performed which revealed no evidence of ischemia or infarction or any arrhythmia.  Likewise troponin was performed which was negative.  Considered possibly pulmonary emboli versus other potential pulmonary illnesses and as such CT scan of chest was performed which revealed no acute pathology.  Spoke with the patient at length regarding need to follow-up with primary care provider is no clear etiology for the patient's dyspnea was  noted. ____________________________________________  FINAL CLINICAL IMPRESSION(S) / ED DIAGNOSES  Final diagnoses:  Shortness of breath     MEDICATIONS GIVEN DURING THIS VISIT:  Medications  alum & mag hydroxide-simeth (MAALOX/MYLANTA) 200-200-20 MG/5ML suspension 30 mL (30 mLs Oral Given 11/18/17 0217)  iopamidol (ISOVUE-370) 76 % injection 75 mL (75 mLs Intravenous Contrast Given 11/18/17 0308)     ED Discharge Orders    None       Note:  This document was prepared using Dragon voice recognition software and may include unintentional dictation errors.    Darci Current, MD 11/19/17 [phone]

## 2017-11-18 NOTE — ED Notes (Signed)
Dr Manson PasseyBrown and Gwynneth MunsonButch, RN at bedside to update pt's on lab/imaging results and plan of care.

## 2017-12-03 ENCOUNTER — Other Ambulatory Visit: Payer: Self-pay

## 2017-12-03 ENCOUNTER — Emergency Department: Payer: 59

## 2017-12-03 ENCOUNTER — Emergency Department
Admission: EM | Admit: 2017-12-03 | Discharge: 2017-12-03 | Disposition: A | Discharge status: Home / Self Care | Payer: 59 | Attending: Student in an Organized Health Care Education/Training Program | Admitting: Student in an Organized Health Care Education/Training Program

## 2017-12-03 DIAGNOSIS — I10 Essential (primary) hypertension: Secondary | ICD-10-CM | POA: Insufficient documentation

## 2017-12-03 DIAGNOSIS — Z79899 Other long term (current) drug therapy: Secondary | ICD-10-CM | POA: Insufficient documentation

## 2017-12-03 DIAGNOSIS — Z87891 Personal history of nicotine dependence: Secondary | ICD-10-CM | POA: Insufficient documentation

## 2017-12-03 DIAGNOSIS — R198 Other specified symptoms and signs involving the digestive system and abdomen: Secondary | ICD-10-CM

## 2017-12-03 DIAGNOSIS — K228 Other specified diseases of esophagus: Secondary | ICD-10-CM

## 2017-12-03 DIAGNOSIS — R0989 Other specified symptoms and signs involving the circulatory and respiratory systems: Secondary | ICD-10-CM | POA: Insufficient documentation

## 2017-12-03 MED ORDER — LIDOCAINE VISCOUS HCL 2 % MT SOLN: 10.0000 mL | OROMUCOSAL | 0 refills | Status: DC | PRN

## 2017-12-03 MED ORDER — ALUM & MAG HYDROXIDE-SIMETH 200-200-20 MG/5ML PO SUSP
15.0000 mL | Freq: Once | ORAL | Status: AC
  Administered 2017-12-03: 15 mL via ORAL
  Filled 2017-12-03: qty 30

## 2017-12-03 MED ORDER — ALUM & MAG HYDROXIDE-SIMETH 400-400-40 MG/5ML PO SUSP: 10.0000 mL | ORAL | 0 refills | Status: DC | PRN

## 2017-12-03 MED ORDER — LIDOCAINE VISCOUS HCL 2 % MT SOLN
15.0000 mL | Freq: Once | OROMUCOSAL | Status: AC
  Administered 2017-12-03: 15 mL via OROMUCOSAL
  Filled 2017-12-03: qty 15

## 2017-12-03 NOTE — ED Notes (Signed)
Pt states that she swallowed a hard object on Monday morning and feels like the object is still in her throat. Pt states that she thought maybe the pain would go away but it has felt as if the swelling has gotten worse.

## 2017-12-03 NOTE — ED Triage Notes (Addendum)
Pt arrives to ED via POV from home with c/o sore throat and "possible swallowed foreign body" on Monday morning. Pt states she was drinking coffee and feels like she "swallowed something hard". Pt with c/o scratchy throat, no c/o difficulty breathing, swallowing or managing oral secretions. Pt speaking clearly and in complete sentences without difficulty.

## 2017-12-03 NOTE — ED Provider Notes (Signed)
[facility] Emergency Department Provider Note  ____________________________________________  Time seen: Approximately 8:05 PM  I have reviewed the triage vital signs and the nursing notes.   HISTORY  Chief Complaint Sore Throat and Swallowed Foreign Body    HPI Shirley Stewart is a 37 y.o. female who presents the emergency department complaining of foreign body sensation in her throat.  Patient reports that she has been coughing at home, put into a stainless steel tumbler, was drinking on the way to work when she felt a foreign body sensation being swallowed.  Patient reports that over the past 2 days she has had an ongoing foreign body sensation at the height of the larynx.  No difficulty breathing or speaking.  Patient is able to swallow appropriately.  She denies any hemoptysis or hematemesis.  Patient does have a history of reflux, states that symptoms are not consistent.  No abdominal pain, diarrhea or constipation.  No bloody stools.  No medications for this complaint prior to arrival.  No other complaints at this time.    Past Medical History:  Diagnosis Date  . GERD (gastroesophageal reflux disease)   . Hypertension     There are no active problems to display for this patient.   Past Surgical History:  Procedure Laterality Date  . TUBAL LIGATION      Prior to Admission medications   Medication Sig Start Date End Date Taking? Authorizing Provider  alum & mag hydroxide-simeth (MAALOX MAX) 400-400-40 MG/5ML suspension Take 10 mLs by mouth every 4 (four) hours as needed for indigestion. Mix with lidocaine and swallow 12/03/17   Cuthriell, Delorise Royals, PA-C  aspirin EC 325 MG tablet Take 650 mg by mouth every 6 (six) hours as needed for mild pain.    [provider]  aspirin-sod bicarb-citric acid (ALKA-SELTZER) 325 MG TBEF tablet Take 325 mg by mouth every 6 (six) hours as needed (indigestion).    [provider]  D3-50 50000 units  capsule Take 50,000 Units by mouth once a week. 08/26/17   [provider]  hydrochlorothiazide (HYDRODIURIL) 12.5 MG tablet Take 2 tablets (25 mg total) by mouth daily. 04/24/17   Merrily Brittle, MD  lidocaine (XYLOCAINE) 2 % solution Use as directed 10 mLs in the mouth or throat every 4 (four) hours as needed for mouth pain. Add with maalox and swallow 12/03/17   Cuthriell, Delorise Royals, PA-C    Allergies Patient has no known allergies.  No family history on file.  Social History Social History   Tobacco Use  . Smoking status: Former Smoker    Packs/day: 1.00    Types: Cigarettes    Last attempt to quit: 07/28/2015    Years since quitting: 2.3  . Smokeless tobacco: Never Used  Substance Use Topics  . Alcohol use: No  . Drug use: No     Review of Systems  Constitutional: No fever/chills Eyes: No visual changes. No discharge ENT: Positive for foreign body sensation at height of larynx. Cardiovascular: no chest pain. Respiratory: no cough. No SOB. Gastrointestinal: No abdominal pain.  No nausea, no vomiting. No hematemesis or bloody stools. No diarrhea.  No constipation. Musculoskeletal: Negative for musculoskeletal pain. Skin: Negative for rash, abrasions, lacerations, ecchymosis. Neurological: Negative for headaches, focal weakness or numbness. 10-point ROS otherwise negative.  ____________________________________________   PHYSICAL EXAM:  VITAL SIGNS: ED Triage Vitals  Enc Vitals Group     BP 12/03/17 1908 (!) 141/88     Pulse Rate 12/03/17 1908 85  Resp 12/03/17 1908 18     Temp 12/03/17 1908 98.5 F (36.9 C)     Temp Source 12/03/17 1908 Oral     SpO2 12/03/17 1908 97 %     Weight 12/03/17 1909 202 lb (91.6 kg)     Height 12/03/17 1909 5\' 1"  (1.549 m)     Head Circumference --      Peak Flow --      Pain Score 12/03/17 1923 9     Pain Loc --      Pain Edu? --      Excl. in GC? --      Constitutional: Alert and oriented. Well appearing and in no  acute distress. Eyes: Conjunctivae are normal. PERRL. EOMI. Head: Atraumatic. ENT:      Ears:       Nose: No congestion/rhinnorhea.      Mouth/Throat: Mucous membranes are moist.  Pharynx is nonerythematous and nonedematous.  Uvula is midline.  No visible foreign body.  No visible oropharyngeal trauma. Neck: No stridor.  Auscultation reveals no stridor or coarse breath sounds.  No visible or palpable abnormality along the anterior neck. Hematological/Lymphatic/Immunilogical: No cervical lymphadenopathy. Cardiovascular: Normal rate, regular rhythm. Normal S1 and S2.  Good peripheral circulation. Respiratory: Normal respiratory effort without tachypnea or retractions. Lungs CTAB. Good air entry to the bases with no decreased or absent breath sounds. Musculoskeletal: Full range of motion to all extremities. No gross deformities appreciated. Neurologic:  Normal speech and language. No gross focal neurologic deficits are appreciated.  Skin:  Skin is warm, dry and intact. No rash noted. Psychiatric: Mood and affect are normal. Speech and behavior are normal. Patient exhibits appropriate insight and judgement.   ____________________________________________   LABS (all labs ordered are listed, but only abnormal results are displayed)  Labs Reviewed - No data to display ____________________________________________  EKG   ____________________________________________  RADIOLOGY I personally viewed and evaluated these images as part of my medical decision making, as well as reviewing the written report by the radiologist.  I concur with radiologist finding of no visible radiopaque foreign body.  No retropharyngeal soft tissue edema.  Dg Neck Soft Tissue  Result Date: 12/03/2017 CLINICAL DATA:  Sore throat and possibly swallowed a foreign body. EXAM: NECK SOFT TISSUES - 1+ VIEW COMPARISON:  None. FINDINGS: There is no evidence of retropharyngeal soft tissue swelling or epiglottic enlargement.  The cervical airway is unremarkable and no radio-opaque foreign body identified. A zipper is identified in the upper mid chest. IMPRESSION: The airway is patent. There is a zipper identified in the upper mid chest. Otherwise no radiopaque foreign body noted. Electronically Signed   By: Sherian Rein M.D.   On: 12/03/2017 21:01    ____________________________________________    PROCEDURES  Procedure(s) performed:    Procedures    Medications  lidocaine (XYLOCAINE) 2 % viscous mouth solution 15 mL (has no administration in time range)  alum & mag hydroxide-simeth (MAALOX/MYLANTA) 200-200-20 MG/5ML suspension 15 mL (has no administration in time range)     ____________________________________________   INITIAL IMPRESSION / ASSESSMENT AND PLAN / ED COURSE  Pertinent labs & imaging results that were available during my care of the patient were reviewed by me and considered in my medical decision making (see chart for details).  Review of the [place] CSRS was performed in accordance of the [facility] prior to dispensing any controlled drugs.      Patient's diagnosis is consistent with foreign body sensation of the esophagus.  Patient  presents the emergency department complaining of foreign body sensation.  Patient reports that she swallowed a hard object, unknown what it was several days prior.  She continues to have a foreign body sensation.  No difficulty breathing or swallowing.  X-ray reveals no radiopaque foreign body.  No indication at this time for further work-up as patient is able to swallow appropriately.  Patient is given viscous lidocaine, Maalox for symptom improvement.  She will be discharged with prescriptions for same.  If symptoms persist, she will follow-up with ENT. Patient is given ED precautions to return to the ED for any worsening or new symptoms.     ____________________________________________  FINAL CLINICAL IMPRESSION(S) / ED DIAGNOSES  Final diagnoses:  Sensation  of foreign body in esophagus      NEW MEDICATIONS STARTED DURING THIS VISIT:  ED Discharge Orders         Ordered    lidocaine (XYLOCAINE) 2 % solution  Every 4 hours PRN     12/03/17 2134    alum & mag hydroxide-simeth (MAALOX MAX) 400-400-40 MG/5ML suspension  Every 4 hours PRN     12/03/17 2134              This chart was dictated using voice recognition software/Dragon. Despite best efforts to proofread, errors can occur which can change the meaning. Any change was purely unintentional.    Racheal Patches, PA-C 12/03/17 2134    Willy Eddy, MD 12/03/17 2241

## 2017-12-27 IMAGING — CR DG CHEST 2V
1 series · 2 of 2 positions shown · non-contrast
Comparison: 07/30/2016

CLINICAL DATA: Shortness of breath, chest pain off and on for 2
months

EXAM:
CHEST  2 VIEW

[Series 1: w chest pa · 0.14mm/px · 2 of 2 slices shown]
[im 1/2]
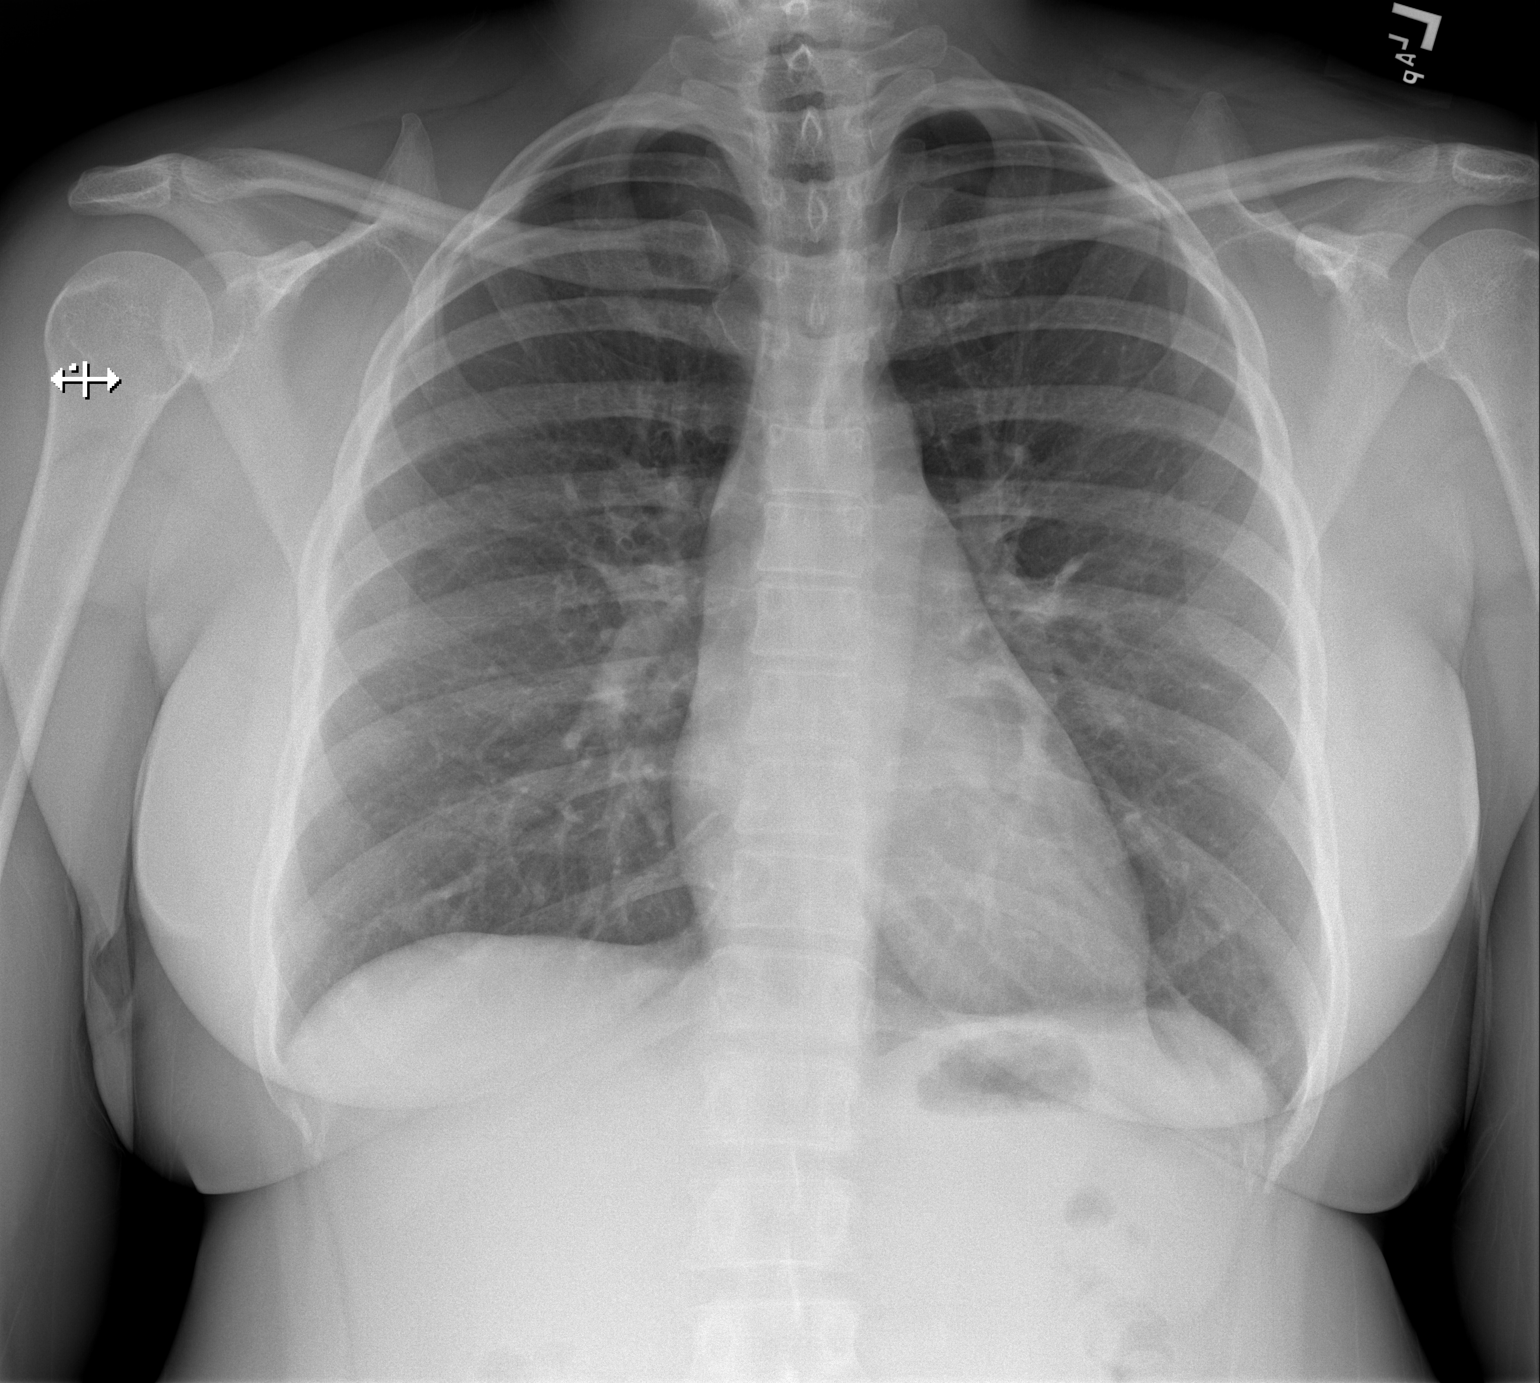
[im 2/2]
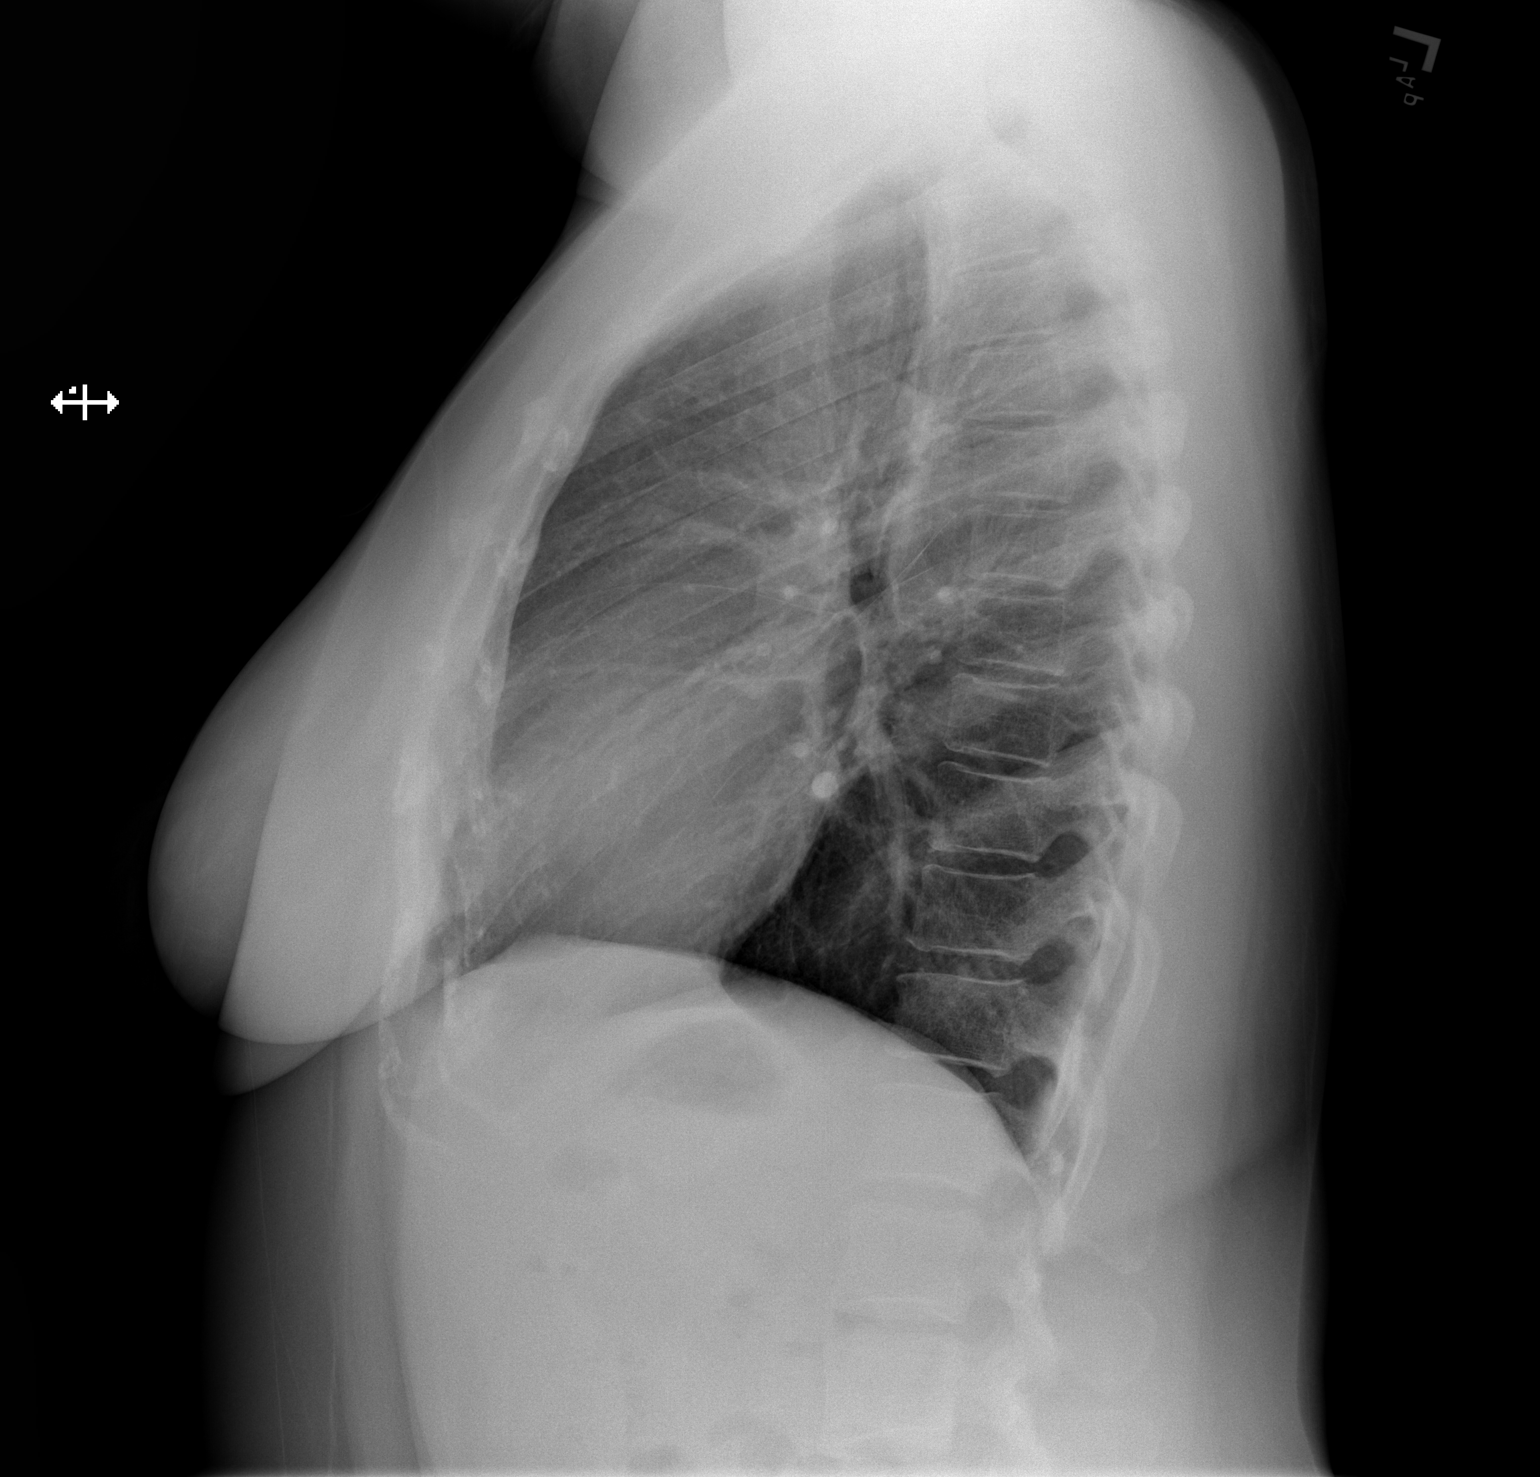

[2 of 2 positions shown; findings below may reference images not displayed]

FINDINGS: Heart and mediastinal contours are within normal limits. No focal
opacities or effusions. No acute bony abnormality.
IMPRESSION: No active cardiopulmonary disease.

## 2017-12-27 IMAGING — CR DG KNEE 1-2V*R*
1 series · 2 of 2 positions shown · non-contrast
Comparison: None.

CLINICAL DATA: Right knee pain for 1 year

EXAM:
RIGHT KNEE - 1-2 VIEW

[Series 1: t knee ap right · 0.14mm/px · 2 of 2 slices shown]
[im 1/2]
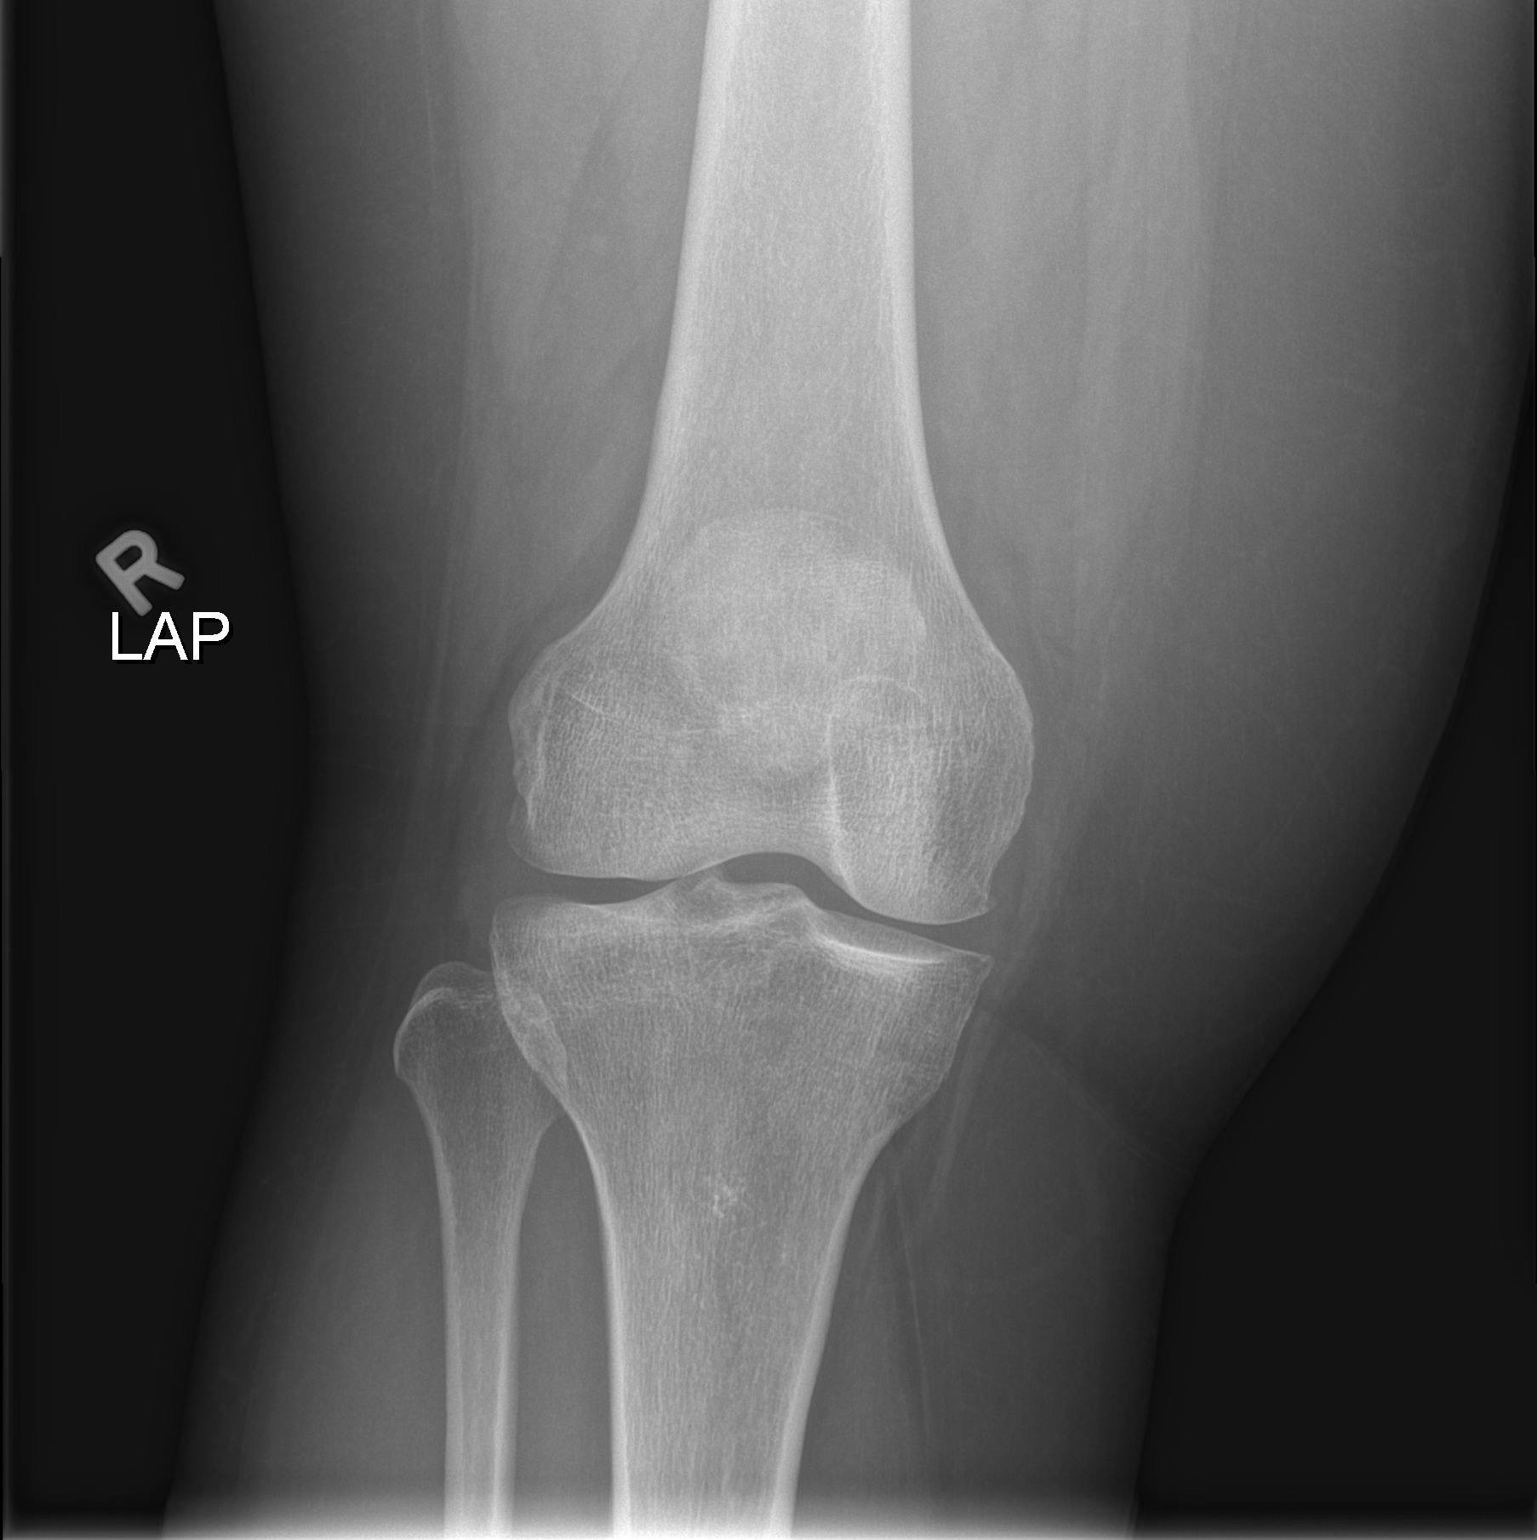
[im 2/2]
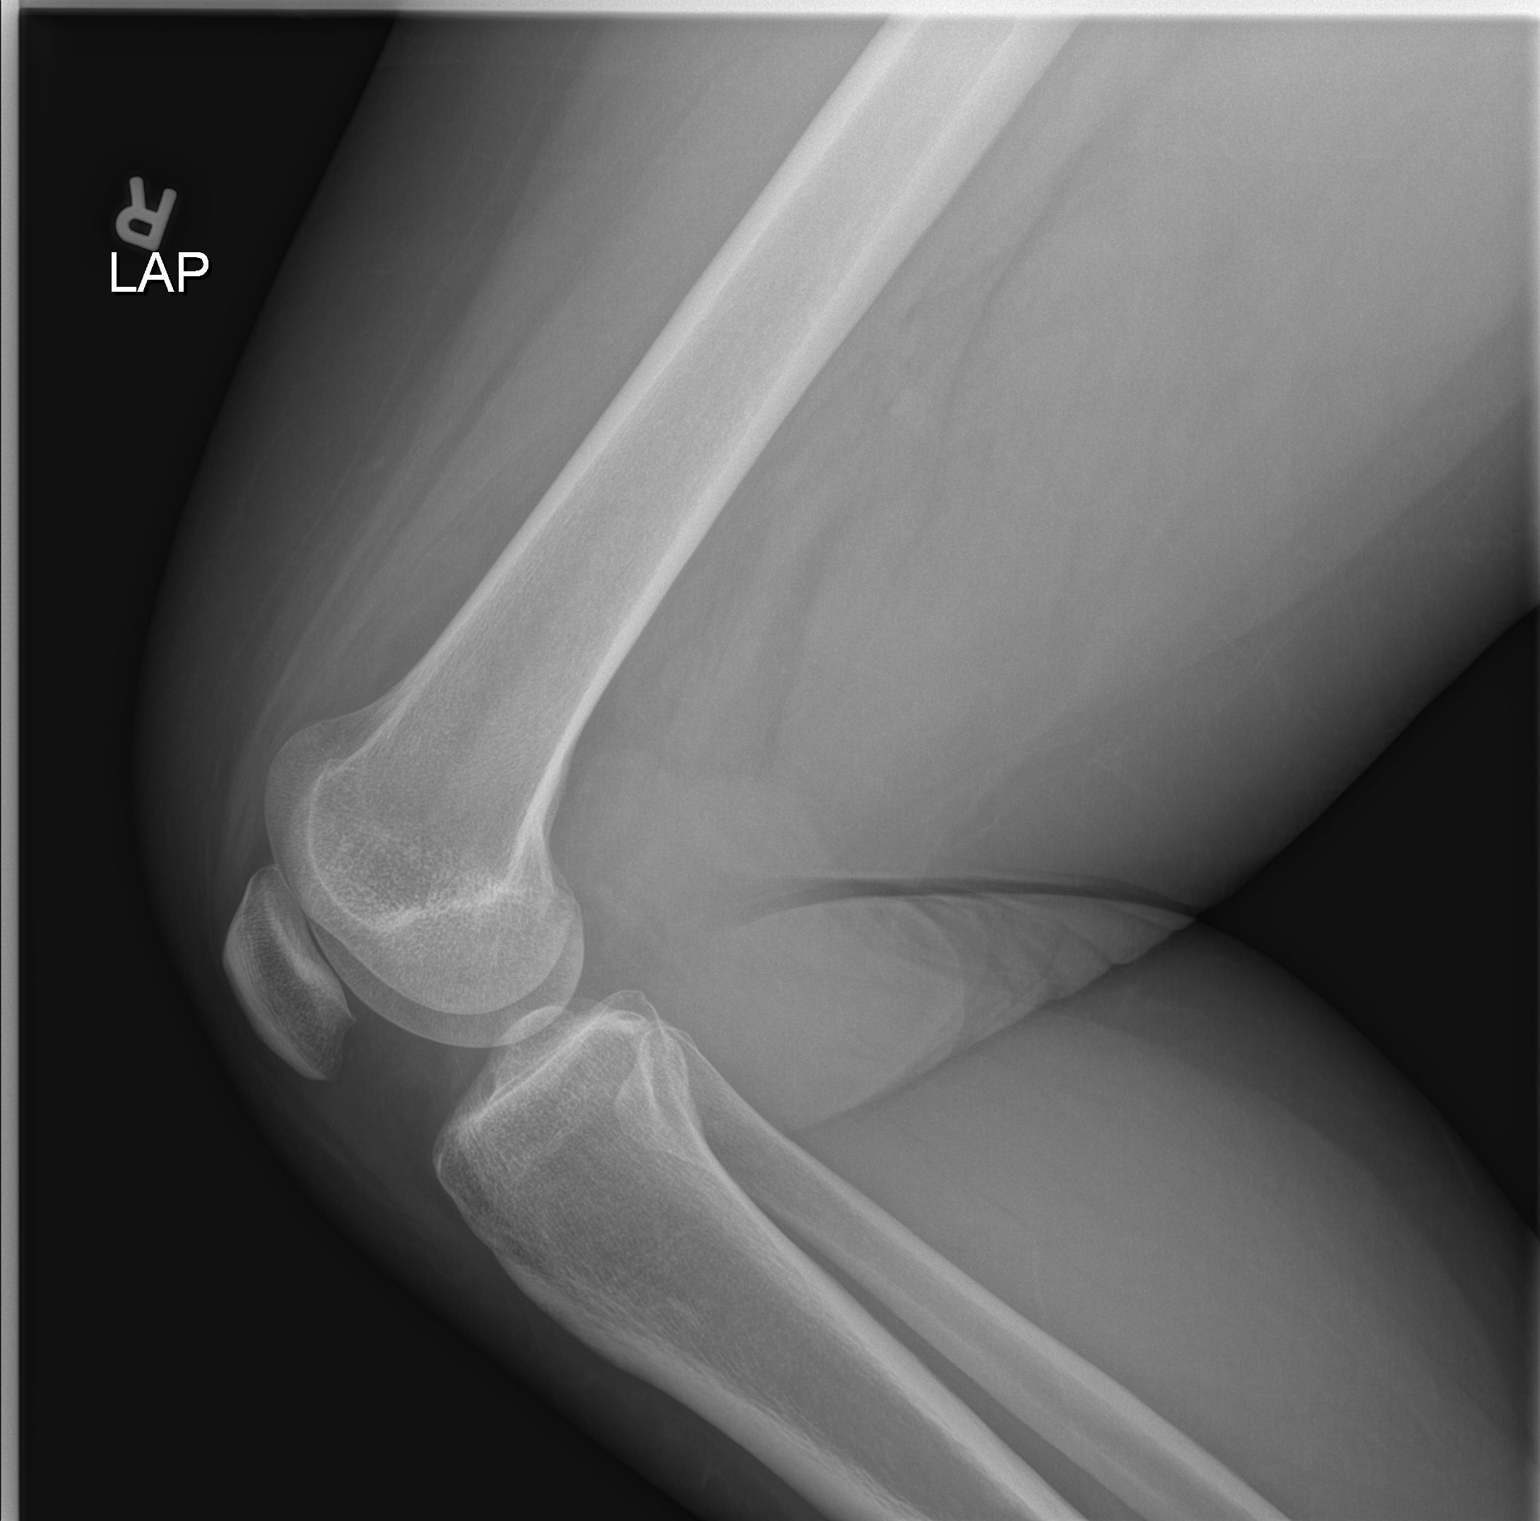

[2 of 2 positions shown; findings below may reference images not displayed]

FINDINGS: No acute fracture. No dislocation. Mild tricompartment
osteoarthritic change.
IMPRESSION: No acute bony pathology.

## 2017-12-30 ENCOUNTER — Ambulatory Visit
Admission: RE | Admit: 2017-12-30 | Discharge: 2017-12-30 | Disposition: A | Discharge status: Home / Self Care | Payer: PRIVATE HEALTH INSURANCE | Source: Ambulatory Visit | Attending: Internal Medicine | Admitting: Internal Medicine

## 2017-12-30 ENCOUNTER — Other Ambulatory Visit: Payer: Self-pay | Admitting: Internal Medicine

## 2017-12-30 DIAGNOSIS — K223 Perforation of esophagus: Secondary | ICD-10-CM

## 2017-12-30 MED ORDER — IOPAMIDOL (ISOVUE-300) INJECTION 61%
50.0000 mL | Freq: Once | INTRAVENOUS | Status: AC | PRN
  Administered 2017-12-30: 50 mL via ORAL

## 2018-01-17 ENCOUNTER — Encounter: Payer: Self-pay | Admitting: Emergency Medicine

## 2018-01-17 ENCOUNTER — Emergency Department: Payer: PRIVATE HEALTH INSURANCE

## 2018-01-17 ENCOUNTER — Other Ambulatory Visit: Payer: Self-pay

## 2018-01-17 DIAGNOSIS — R072 Precordial pain: Secondary | ICD-10-CM | POA: Diagnosis present

## 2018-01-17 DIAGNOSIS — Z5321 Procedure and treatment not carried out due to patient leaving prior to being seen by health care provider: Secondary | ICD-10-CM | POA: Insufficient documentation

## 2018-01-17 LAB — BASIC METABOLIC PANEL
Anion gap: 9 (ref 5–15)
BUN: 8 mg/dL (ref 6–20)
CHLORIDE: 104 mmol/L (ref 98–111)
CO2: 25 mmol/L (ref 22–32)
CREATININE: 0.95 mg/dL (ref 0.44–1.00)
Calcium: 9.3 mg/dL (ref 8.9–10.3)
GFR calc Af Amer: >60 mL/min (ref >60)
GLUCOSE: 98 mg/dL (ref 70–99)
POTASSIUM: 3.6 mmol/L (ref 3.5–5.1)
SODIUM: 138 mmol/L (ref 135–145)

## 2018-01-17 LAB — CBC
HEMATOCRIT: 36.4 % (ref 36.0–46.0)
Hemoglobin: 11.9 g/dL — ABNORMAL LOW (ref 12.0–15.0)
MCH: 26 pg (ref 26.0–34.0)
MCHC: 32.7 g/dL (ref 30.0–36.0)
MCV: 79.6 fL — ABNORMAL LOW (ref 80.0–100.0)
Platelets: 241 10*3/uL (ref 150–400)
RBC: 4.57 MIL/uL (ref 3.87–5.11)
RDW: 14 % (ref 11.5–15.5)
WBC: 8.7 10*3/uL (ref 4.0–10.5)
nRBC: 0 % (ref 0.0–0.2)

## 2018-01-17 LAB — URINALYSIS, COMPLETE (UACMP) WITH MICROSCOPIC
BACTERIA UA: NONE SEEN
BILIRUBIN URINE: NEGATIVE
Glucose, UA: NEGATIVE mg/dL
HGB URINE DIPSTICK: NEGATIVE
KETONES UR: 20 mg/dL — AB
LEUKOCYTES UA: NEGATIVE
NITRITE: NEGATIVE
PH: 5 (ref 5.0–8.0)
Protein, ur: NEGATIVE mg/dL
SPECIFIC GRAVITY, URINE: 1.028 (ref 1.005–1.030)

## 2018-01-17 LAB — POCT PREGNANCY, URINE: Preg Test, Ur: NEGATIVE

## 2018-01-17 LAB — TROPONIN I: Troponin I: <0.03 ng/mL (ref <0.03)

## 2018-01-17 NOTE — ED Triage Notes (Signed)
Pt arrives POV and ambulatory with steady gait to triage with c/o mid sternal chest pain today that has been coming and going.

## 2018-01-18 ENCOUNTER — Emergency Department
Admission: EM | Admit: 2018-01-18 | Discharge: 2018-01-18 | Disposition: A | Discharge status: Home / Self Care | Payer: PRIVATE HEALTH INSURANCE | Attending: Emergency Medicine | Admitting: Emergency Medicine

## 2018-01-19 ENCOUNTER — Telehealth: Payer: Self-pay | Admitting: Emergency Medicine

## 2018-01-19 NOTE — Telephone Encounter (Signed)
Called patient due to lwot to inquire about condition and follow up plans. Agrees to tell her doctor about her symptoms and that results are available.  Explained importance of physician exam to determine diagnosis.

## 2018-01-22 ENCOUNTER — Emergency Department
Admission: EM | Admit: 2018-01-22 | Discharge: 2018-01-22 | Disposition: A | Discharge status: Home / Self Care | Payer: PRIVATE HEALTH INSURANCE | Attending: Emergency Medicine | Admitting: Emergency Medicine

## 2018-01-22 ENCOUNTER — Encounter: Payer: Self-pay | Admitting: Emergency Medicine

## 2018-01-22 ENCOUNTER — Other Ambulatory Visit: Payer: Self-pay

## 2018-01-22 ENCOUNTER — Emergency Department: Payer: PRIVATE HEALTH INSURANCE

## 2018-01-22 DIAGNOSIS — J029 Acute pharyngitis, unspecified: Secondary | ICD-10-CM | POA: Insufficient documentation

## 2018-01-22 DIAGNOSIS — I10 Essential (primary) hypertension: Secondary | ICD-10-CM | POA: Diagnosis not present

## 2018-01-22 DIAGNOSIS — Z79899 Other long term (current) drug therapy: Secondary | ICD-10-CM | POA: Insufficient documentation

## 2018-01-22 DIAGNOSIS — Z87891 Personal history of nicotine dependence: Secondary | ICD-10-CM | POA: Insufficient documentation

## 2018-01-22 LAB — COMPREHENSIVE METABOLIC PANEL
ALT: 14 U/L (ref 0–44)
AST: 18 U/L (ref 15–41)
Albumin: 4.4 g/dL (ref 3.5–5.0)
Alkaline Phosphatase: 66 U/L (ref 38–126)
Anion gap: 7 (ref 5–15)
BUN: 7 mg/dL (ref 6–20)
CO2: 30 mmol/L (ref 22–32)
Calcium: 9.3 mg/dL (ref 8.9–10.3)
Chloride: 103 mmol/L (ref 98–111)
Creatinine, Ser: 0.96 mg/dL (ref 0.44–1.00)
GFR calc Af Amer: >60 mL/min (ref >60)
GFR calc non Af Amer: >60 mL/min (ref >60)
Glucose, Bld: 96 mg/dL (ref 70–99)
Potassium: 3.4 mmol/L — ABNORMAL LOW (ref 3.5–5.1)
Sodium: 140 mmol/L (ref 135–145)
Total Bilirubin: 0.6 mg/dL (ref 0.3–1.2)
Total Protein: 7.7 g/dL (ref 6.5–8.1)

## 2018-01-22 LAB — POCT PREGNANCY, URINE: PREG TEST UR: NEGATIVE

## 2018-01-22 LAB — CBC WITH DIFFERENTIAL/PLATELET
Abs Immature Granulocytes: 0.03 10*3/uL (ref 0.00–0.07)
Basophils Absolute: 0.1 10*3/uL (ref 0.0–0.1)
Basophils Relative: 1 %
Eosinophils Absolute: 0.1 10*3/uL (ref 0.0–0.5)
Eosinophils Relative: 1 %
HCT: 39 % (ref 36.0–46.0)
Hemoglobin: 12.4 g/dL (ref 12.0–15.0)
Immature Granulocytes: 0 %
Lymphocytes Relative: 26 %
Lymphs Abs: 2 10*3/uL (ref 0.7–4.0)
MCH: 25.8 pg — ABNORMAL LOW (ref 26.0–34.0)
MCHC: 31.8 g/dL (ref 30.0–36.0)
MCV: 81.3 fL (ref 80.0–100.0)
Monocytes Absolute: 0.5 10*3/uL (ref 0.1–1.0)
Monocytes Relative: 7 %
Neutro Abs: 5.2 10*3/uL (ref 1.7–7.7)
Neutrophils Relative %: 65 %
Platelets: 275 10*3/uL (ref 150–400)
RBC: 4.8 MIL/uL (ref 3.87–5.11)
RDW: 13.8 % (ref 11.5–15.5)
WBC: 8 10*3/uL (ref 4.0–10.5)
nRBC: 0 % (ref 0.0–0.2)

## 2018-01-22 LAB — GROUP A STREP BY PCR: Group A Strep by PCR: NOT DETECTED

## 2018-01-22 MED ORDER — IOHEXOL 300 MG/ML  SOLN
75.0000 mL | Freq: Once | INTRAMUSCULAR | Status: AC | PRN
  Administered 2018-01-22: 75 mL via INTRAVENOUS
  Filled 2018-01-22: qty 75

## 2018-01-22 MED ORDER — PREDNISOLONE SODIUM PHOSPHATE 15 MG/5ML PO SOLN: 30.0000 mg | Freq: Two times a day (BID) | ORAL | 0 refills | Status: AC

## 2018-01-22 NOTE — ED Provider Notes (Signed)
[facility] Emergency Department Provider Note  ____________________________________________  Time seen: Approximately 6:29 PM  I have reviewed the triage vital signs and the nursing notes.   HISTORY  Chief Complaint Sore Throat    HPI Shirley Stewart is a 37 y.o. female presents to the emergency department with persistent pharyngitis for the past 8 days after having an EGD.  Patient reports that her symptoms originally started with a foreign body sensation while driving and consuming water in September 2019.  Patient's soft tissue neck films were reassuring and patient was discharged with follow-up to otolaryngology.  Patient ultimately had a EGD with esophageal  dilation by GI and was diagnosed with GERD.  Patient has been taking multiple antiacid medications but patient reports that her symptoms are still persistent.  Patient had follow-up appointment with GI and they conducted barium swallow which was noncontributory for perforation.  Patient reports that she has had fever and chills at home with headache and some nausea.  Patient reports that pharyngitis is severe enough that she cannot eat but is able to tolerate some fluids.  No cough or hemoptysis.   Past Medical History:  Diagnosis Date  . GERD (gastroesophageal reflux disease)   . Hypertension     There are no active problems to display for this patient.   Past Surgical History:  Procedure Laterality Date  . TUBAL LIGATION      Prior to Admission medications   Medication Sig Start Date End Date Taking? Authorizing Provider  alum & mag hydroxide-simeth (MAALOX MAX) 400-400-40 MG/5ML suspension Take 10 mLs by mouth every 4 (four) hours as needed for indigestion. Mix with lidocaine and swallow 12/03/17   Cuthriell, Delorise Royals, PA-C  aspirin EC 325 MG tablet Take 650 mg by mouth every 6 (six) hours as needed for mild pain.    [provider]  aspirin-sod bicarb-citric acid (ALKA-SELTZER) 325 MG  TBEF tablet Take 325 mg by mouth every 6 (six) hours as needed (indigestion).    [provider]  D3-50 50000 units capsule Take 50,000 Units by mouth once a week. 08/26/17   [provider]  hydrochlorothiazide (HYDRODIURIL) 12.5 MG tablet Take 2 tablets (25 mg total) by mouth daily. 04/24/17   Merrily Brittle, MD  lidocaine (XYLOCAINE) 2 % solution Use as directed 10 mLs in the mouth or throat every 4 (four) hours as needed for mouth pain. Add with maalox and swallow 12/03/17   Cuthriell, Delorise Royals, PA-C  prednisoLONE (ORAPRED) 15 MG/5ML solution Take 10 mLs (30 mg total) by mouth 2 (two) times daily for 5 days. Tale 10 mL by mouth twice daily for the next five days. 01/22/18 01/27/18  Orvil Feil, PA-C    Allergies Patient has no known allergies.  No family history on file.  Social History Social History   Tobacco Use  . Smoking status: Former Smoker    Packs/day: 1.00    Types: Cigarettes    Last attempt to quit: 07/28/2015    Years since quitting: 2.4  . Smokeless tobacco: Never Used  Substance Use Topics  . Alcohol use: No  . Drug use: No     Review of Systems  Constitutional: Patient has fever. Eyes: No visual changes. No discharge ENT: Patient has pharyngitis. Cardiovascular: no chest pain. Respiratory: no cough. No SOB. Gastrointestinal: No abdominal pain.  Patient has nausea.  No vomiting.  No diarrhea.  No constipation. Genitourinary: Negative for dysuria. No hematuria Musculoskeletal: Negative for musculoskeletal pain. Skin: Negative  for rash, abrasions, lacerations, ecchymosis. Neurological: Negative for headaches, focal weakness or numbness.   ____________________________________________   PHYSICAL EXAM:  VITAL SIGNS: ED Triage Vitals  Enc Vitals Group     BP 01/22/18 1742 121/83     Pulse Rate 01/22/18 1742 89     Resp 01/22/18 1742 18     Temp 01/22/18 1742 98.6 F (37 C)     Temp Source 01/22/18 1742 Oral     SpO2 01/22/18 1742  100 %     Weight 01/22/18 1743 195 lb (88.5 kg)     Height 01/22/18 1743 5' (1.524 m)     Head Circumference --      Peak Flow --      Pain Score 01/22/18 1743 8     Pain Loc --      Pain Edu? --      Excl. in GC? --      Constitutional: Alert and oriented. Well appearing and in no acute distress. Eyes: Conjunctivae are normal. PERRL. EOMI. Head: Atraumatic. ENT:      Ears: TMs are pearly.      Nose: No congestion/rhinnorhea.      Mouth/Throat: Mucous membranes are moist.  Posterior pharynx is not erythematous.  Airway appears patent. Neck: No stridor.  No cervical spine tenderness to palpation. Hematological/Lymphatic/Immunilogical: No cervical lymphadenopathy. Cardiovascular: Normal rate, regular rhythm. Normal S1 and S2.  Good peripheral circulation. Respiratory: Normal respiratory effort without tachypnea or retractions. Lungs CTAB. Good air entry to the bases with no decreased or absent breath sounds. Gastrointestinal: Bowel sounds 4 quadrants. Soft and nontender to palpation. No guarding or rigidity. No palpable masses. No distention. No CVA tenderness. Musculoskeletal: Full range of motion to all extremities. No gross deformities appreciated. Neurologic:  Normal speech and language. No gross focal neurologic deficits are appreciated.  Skin:  Skin is warm, dry and intact. No rash noted. Psychiatric: Mood and affect are normal. Speech and behavior are normal. Patient exhibits appropriate insight and judgement.   ____________________________________________   LABS (all labs ordered are listed, but only abnormal results are displayed)  Labs Reviewed  CBC WITH DIFFERENTIAL/PLATELET - Abnormal; Notable for the following components:      Result Value   MCH 25.8 (*)    All other components within normal limits  COMPREHENSIVE METABOLIC PANEL - Abnormal; Notable for the following components:   Potassium 3.4 (*)    All other components within normal limits  GROUP A STREP BY  PCR  POCT PREGNANCY, URINE  POC URINE PREG, ED   ____________________________________________  EKG   ____________________________________________  RADIOLOGY I personally viewed and evaluated these images as part of my medical decision making, as well as reviewing the written report by the radiologist.    Ct Soft Tissue Neck W Contrast  Result Date: 01/22/2018 CLINICAL DATA:  Sore throat for 1 week EXAM: CT NECK WITH CONTRAST TECHNIQUE: Multidetector CT imaging of the neck was performed using the standard protocol following the bolus administration of intravenous contrast. CONTRAST:  75mL OMNIPAQUE IOHEXOL 300 MG/ML  SOLN COMPARISON:  None. FINDINGS: PHARYNX AND LARYNX: --Nasopharynx: Fossae of Rosenmuller are clear. Normal adenoid tonsils for age. --Oral cavity and oropharynx: The palatine and lingual tonsils are normal. The visible oral cavity and floor of mouth are normal. --Hypopharynx: Normal vallecula and pyriform sinuses. --Larynx: Normal epiglottis and pre-epiglottic space. Normal aryepiglottic and vocal folds. --Retropharyngeal space: No abscess, effusion or lymphadenopathy. SALIVARY GLANDS: --Parotid: No mass lesion or inflammation. No sialolithiasis or ductal  dilatation. --Submandibular: Symmetric without inflammation. No sialolithiasis or ductal dilatation. --Sublingual: Normal. No ranula or other visible lesion of the base of tongue and floor of mouth. THYROID: Normal. LYMPH NODES: No enlarged or abnormal density lymph nodes. VASCULAR: Major cervical vessels are patent. LIMITED INTRACRANIAL: Normal. VISUALIZED ORBITS: Normal. MASTOIDS AND VISUALIZED PARANASAL SINUSES: No fluid levels or advanced mucosal thickening. No mastoid effusion. SKELETON: No bony spinal canal stenosis. No lytic or blastic lesions. UPPER CHEST: Clear. OTHER: None. IMPRESSION: Normal CT of the neck. Electronically Signed   By: Deatra Robinson M.D.   On: 01/22/2018 19:47     ____________________________________________    PROCEDURES  Procedure(s) performed:    Procedures    Medications  iohexol (OMNIPAQUE) 300 MG/ML solution 75 mL (75 mLs Intravenous Contrast Given 01/22/18 1926)     ____________________________________________   INITIAL IMPRESSION / ASSESSMENT AND PLAN / ED COURSE  Pertinent labs & imaging results that were available during my care of the patient were reviewed by me and considered in my medical decision making (see chart for details).  Review of the [place] CSRS was performed in accordance of the [facility] prior to dispensing any controlled drugs.    Assessment and plan:  Patient presents to the emergency department with persistent pharyngitis after being diagnosed with GERD by GI and is currently taking prescribed antacids as directed.  Patient reported that she was having fever and chills and difficulty eating solid foods.  I considered possibility of possible retropharyngeal abscess given her recent EGD.  No abscess or other acute abnormality was identified on CT is soft tissue neck.  Patient was discharged with Orapred in order to reduce esophageal inflammation and encourage eating.  CBC and CMP were reassuring.  No leukocytosis or electrolyte abnormalities that would indicate malnutrition.  Patient was advised to follow-up with GI, Dr. Norma Fredrickson.  Patient voiced understanding.  All patient questions were answered.    ____________________________________________  FINAL CLINICAL IMPRESSION(S) / ED DIAGNOSES  Final diagnoses:  Pharyngitis, unspecified etiology      NEW MEDICATIONS STARTED DURING THIS VISIT:  ED Discharge Orders         Ordered    prednisoLONE (ORAPRED) 15 MG/5ML solution  2 times daily     01/22/18 2000              This chart was dictated using voice recognition software/Dragon. Despite best efforts to proofread, errors can occur which can change the meaning. Any change was purely unintentional.     Gasper Lloyd 01/22/18 2009    Dionne Bucy, MD 01/22/18 2110

## 2018-01-22 NOTE — ED Triage Notes (Addendum)
Sore throat x 1 week. States did have endoscopy 8 days ago but has been able to eat and drink without difficulty since then and has no mediastinal pain. Did have dilation at endoscopic procedure.

## 2018-01-22 NOTE — ED Notes (Signed)
Pt reports  Pain when she swallows   Had endoscopy sev weeks -for reflux  Has narrow  Esophagus    Pt has black spots  At back  Of the throat  Pt  Able to tolerate  Liquids   Sitting upright on exam  Table speaking in complete  sentances

## 2018-02-13 ENCOUNTER — Encounter (HOSPITAL_COMMUNITY): Payer: Self-pay | Admitting: *Deleted

## 2018-02-13 ENCOUNTER — Other Ambulatory Visit: Payer: Self-pay

## 2018-02-13 ENCOUNTER — Emergency Department (HOSPITAL_COMMUNITY): Payer: PRIVATE HEALTH INSURANCE

## 2018-02-13 ENCOUNTER — Emergency Department (HOSPITAL_COMMUNITY)
Admission: EM | Admit: 2018-02-13 | Discharge: 2018-02-13 | Disposition: A | Discharge status: Home / Self Care | Payer: PRIVATE HEALTH INSURANCE | Attending: Emergency Medicine | Admitting: Emergency Medicine

## 2018-02-13 DIAGNOSIS — I10 Essential (primary) hypertension: Secondary | ICD-10-CM | POA: Diagnosis not present

## 2018-02-13 DIAGNOSIS — R002 Palpitations: Secondary | ICD-10-CM | POA: Diagnosis not present

## 2018-02-13 DIAGNOSIS — R079 Chest pain, unspecified: Secondary | ICD-10-CM | POA: Diagnosis not present

## 2018-02-13 LAB — BASIC METABOLIC PANEL
Anion gap: 9 (ref 5–15)
BUN: <5 mg/dL — ABNORMAL LOW (ref 6–20)
CALCIUM: 9.4 mg/dL (ref 8.9–10.3)
CHLORIDE: 105 mmol/L (ref 98–111)
CO2: 24 mmol/L (ref 22–32)
Creatinine, Ser: 1.02 mg/dL — ABNORMAL HIGH (ref 0.44–1.00)
GFR calc Af Amer: >60 mL/min (ref >60)
GFR calc non Af Amer: >60 mL/min (ref >60)
GLUCOSE: 86 mg/dL (ref 70–99)
Potassium: 3.7 mmol/L (ref 3.5–5.1)
Sodium: 138 mmol/L (ref 135–145)

## 2018-02-13 LAB — CBC
HEMATOCRIT: 40.1 % (ref 36.0–46.0)
HEMOGLOBIN: 12.1 g/dL (ref 12.0–15.0)
MCH: 24.4 pg — AB (ref 26.0–34.0)
MCHC: 30.2 g/dL (ref 30.0–36.0)
MCV: 81 fL (ref 80.0–100.0)
NRBC: 0 % (ref 0.0–0.2)
PLATELETS: 266 10*3/uL (ref 150–400)
RBC: 4.95 MIL/uL (ref 3.87–5.11)
RDW: 13.8 % (ref 11.5–15.5)
WBC: 7.6 10*3/uL (ref 4.0–10.5)

## 2018-02-13 LAB — I-STAT BETA HCG BLOOD, ED (MC, WL, AP ONLY)

## 2018-02-13 LAB — I-STAT TROPONIN, ED: Troponin i, poc: 0 ng/mL (ref 0.00–0.08)

## 2018-02-13 MED ORDER — DICLOFENAC SODIUM 50 MG PO TBEC: 50.0000 mg | DELAYED_RELEASE_TABLET | Freq: Two times a day (BID) | ORAL | 0 refills | Status: DC

## 2018-02-13 MED ORDER — CYCLOBENZAPRINE HCL 10 MG PO TABS: 10.0000 mg | ORAL_TABLET | Freq: Two times a day (BID) | ORAL | 0 refills | Status: DC | PRN

## 2018-02-13 NOTE — ED Triage Notes (Signed)
Pt reports having intermittent chest pains since Sunday, now having palpitations and feels like HR is irregular. ekg done and no acute distress noted at this time.

## 2018-02-13 NOTE — Discharge Instructions (Signed)
Test showed no life-threatening condition.  Prescription for anti-inflammatory pain medicine and muscle relaxer.  Follow-up with your primary care doctor as scheduled.

## 2018-02-13 NOTE — ED Notes (Signed)
Patient ambulatory to bathroom with steady gait at this time 

## 2018-02-14 NOTE — ED Provider Notes (Signed)
[facility] EMERGENCY DEPARTMENT Provider Note   CSN: [id number] Arrival date & time: 02/13/18  1625     History   Chief Complaint Chief Complaint  Patient presents with  . Chest Pain  . Palpitations    HPI Shirley Stewart is a 37 y.o. female.  Patient presents with intermittent chest pain and palpitations since Sunday.  Additionally her left chest wall is tender.  She also reports skipping heartbeats intermittently.  No crushing substernal chest pain, dyspnea, diaphoresis, nausea.  Past medical history positive for GERD.  Recent endoscopy with esophageal stretching.  She discontinued cigarettes 3 years ago.  Recent ANA was positive.  Her grandfather died at age 63 of a heart attack.  Normal cholesterol.  Severity of symptoms is moderate.     Past Medical History:  Diagnosis Date  . GERD (gastroesophageal reflux disease)   . Hypertension     There are no active problems to display for this patient.   Past Surgical History:  Procedure Laterality Date  . TUBAL LIGATION       OB History    Gravida  8   Para      Term      Preterm      AB  2   Living  4     SAB  2   TAB      Ectopic      Multiple      Live Births               Home Medications    Prior to Admission medications   Medication Sig Start Date End Date Taking? Authorizing Provider  alum & mag hydroxide-simeth (MAALOX MAX) 400-400-40 MG/5ML suspension Take 10 mLs by mouth every 4 (four) hours as needed for indigestion. Mix with lidocaine and swallow 12/03/17   Cuthriell, Delorise Royals, PA-C  aspirin EC 325 MG tablet Take 650 mg by mouth every 6 (six) hours as needed for mild pain.    [provider]  aspirin-sod bicarb-citric acid (ALKA-SELTZER) 325 MG TBEF tablet Take 325 mg by mouth every 6 (six) hours as needed (indigestion).    [provider]  cyclobenzaprine (FLEXERIL) 10 MG tablet Take 1 tablet (10 mg total) by mouth 2 (two) times daily as needed  for muscle spasms. 02/13/18   Donnetta Hutching, MD  D3-50 50000 units capsule Take 50,000 Units by mouth once a week. 08/26/17   [provider]  diclofenac (VOLTAREN) 50 MG EC tablet Take 1 tablet (50 mg total) by mouth 2 (two) times daily. Prn pain 02/13/18   Donnetta Hutching, MD  hydrochlorothiazide (HYDRODIURIL) 12.5 MG tablet Take 2 tablets (25 mg total) by mouth daily. 04/24/17   Merrily Brittle, MD  lidocaine (XYLOCAINE) 2 % solution Use as directed 10 mLs in the mouth or throat every 4 (four) hours as needed for mouth pain. Add with maalox and swallow 12/03/17   Cuthriell, Delorise Royals, PA-C    Family History History reviewed. No pertinent family history.  Social History Social History   Tobacco Use  . Smoking status: Former Smoker    Packs/day: 1.00    Types: Cigarettes    Last attempt to quit: 07/28/2015    Years since quitting: 2.5  . Smokeless tobacco: Never Used  Substance Use Topics  . Alcohol use: No  . Drug use: No     Allergies   Patient has no known allergies.   Review of Systems Review of Systems  All other  systems reviewed and are negative.    Physical Exam Updated Vital Signs BP 117/90   Pulse 74   Temp 98 F (36.7 C) (Oral)   Resp 14   SpO2 99%   Physical Exam  Constitutional: She is oriented to person, place, and time. She appears well-developed and well-nourished.  HENT:  Head: Normocephalic and atraumatic.  Eyes: Conjunctivae are normal.  Neck: Neck supple.  Cardiovascular: Normal rate and regular rhythm.  Pulmonary/Chest: Effort normal and breath sounds normal.  Abdominal: Soft. Bowel sounds are normal.  Musculoskeletal: Normal range of motion.  Neurological: She is alert and oriented to person, place, and time.  Skin: Skin is warm and dry.  Psychiatric: She has a normal mood and affect. Her behavior is normal.  Nursing note and vitals reviewed.    ED Treatments / Results  Labs (all labs ordered are listed, but only abnormal results are  displayed) Labs Reviewed  BASIC METABOLIC PANEL - Abnormal; Notable for the following components:      Result Value   BUN <5 (*)    Creatinine, Ser 1.02 (*)    All other components within normal limits  CBC - Abnormal; Notable for the following components:   MCH 24.4 (*)    All other components within normal limits  I-STAT TROPONIN, ED  I-STAT BETA HCG BLOOD, ED ([facility], [facility], [facility] ONLY)    EKG EKG Interpretation  Date/Time:  Friday February 13 2018 16:38:43 EST Ventricular Rate:  96 PR Interval:  124 QRS Duration: 94 QT Interval:  348 QTC Calculation: 439 R Axis:   81 Text Interpretation:  Normal sinus rhythm Normal ECG Confirmed by Donnetta Hutchingook, Kalden Wanke ([id number]) on 02/13/2018 6:25:05 PM   Radiology Dg Chest 2 View  Result Date: 02/13/2018 CLINICAL DATA:  Chest pain since Sunday. EXAM: CHEST - 2 VIEW COMPARISON:  01/17/2018 FINDINGS: The heart size and mediastinal contours are within normal limits. Both lungs are clear. The visualized skeletal structures are unremarkable. IMPRESSION: No active cardiopulmonary disease. Electronically Signed   By: David  Kwon M.D.   On: 02/13/2018 19:07    Procedures Procedures (including critical care time)  Medications Ordered in ED Medications - No data to display   Initial Impression / Assessment and Plan / ED Course  I have reviewed the triage vital signs and the nursing notes.  Pertinent labs & imaging results that were available during my care of the patient were reviewed by me and considered in my medical decision making (see chart for details).     Patient is low risk for ACS or PE.  Work-up including labs, troponin, chest x-ray, EKG all negative.  Her chest wall is tender.  Will Rx Flexeril 10 mg and Voltaren 50 mg.  She will follow-up with her primary care doctor or cardiologist.  Final Clinical Impressions(s) / ED Diagnoses   Final diagnoses:  Chest pain, unspecified type  Palpitations    ED Discharge Orders         Ordered     diclofenac (VOLTAREN) 50 MG EC tablet  2 times daily     02/13/18 2043    cyclobenzaprine (FLEXERIL) 10 MG tablet  2 times daily PRN     11 /22/19 2043           Donnetta Hutchingook, Rommie Dunn, MD 02/14/18 [phone]

## 2018-03-28 ENCOUNTER — Encounter: Payer: Self-pay | Admitting: Emergency Medicine

## 2018-03-28 ENCOUNTER — Other Ambulatory Visit: Payer: Self-pay

## 2018-03-28 DIAGNOSIS — Z87891 Personal history of nicotine dependence: Secondary | ICD-10-CM | POA: Diagnosis not present

## 2018-03-28 DIAGNOSIS — Z7982 Long term (current) use of aspirin: Secondary | ICD-10-CM | POA: Diagnosis not present

## 2018-03-28 DIAGNOSIS — Z79899 Other long term (current) drug therapy: Secondary | ICD-10-CM | POA: Diagnosis not present

## 2018-03-28 DIAGNOSIS — R200 Anesthesia of skin: Secondary | ICD-10-CM | POA: Insufficient documentation

## 2018-03-28 DIAGNOSIS — I10 Essential (primary) hypertension: Secondary | ICD-10-CM | POA: Diagnosis not present

## 2018-03-28 LAB — BASIC METABOLIC PANEL
ANION GAP: 7 (ref 5–15)
BUN: 8 mg/dL (ref 6–20)
CO2: 26 mmol/L (ref 22–32)
CREATININE: 0.86 mg/dL (ref 0.44–1.00)
Calcium: 9.1 mg/dL (ref 8.9–10.3)
Chloride: 101 mmol/L (ref 98–111)
GFR calc Af Amer: >60 mL/min (ref >60)
Glucose, Bld: 95 mg/dL (ref 70–99)
POTASSIUM: 3.5 mmol/L (ref 3.5–5.1)
SODIUM: 134 mmol/L — AB (ref 135–145)

## 2018-03-28 LAB — URINALYSIS, COMPLETE (UACMP) WITH MICROSCOPIC
Bacteria, UA: NONE SEEN
Bilirubin Urine: NEGATIVE
Glucose, UA: NEGATIVE mg/dL
Hgb urine dipstick: NEGATIVE
Ketones, ur: NEGATIVE mg/dL
Leukocytes, UA: NEGATIVE
Nitrite: NEGATIVE
PROTEIN: NEGATIVE mg/dL
Specific Gravity, Urine: 1.01 (ref 1.005–1.030)
pH: 5 (ref 5.0–8.0)

## 2018-03-28 LAB — CBC WITH DIFFERENTIAL/PLATELET
Abs Immature Granulocytes: 0.03 10*3/uL (ref 0.00–0.07)
BASOS PCT: 1 %
Basophils Absolute: 0.1 10*3/uL (ref 0.0–0.1)
EOS ABS: 0.2 10*3/uL (ref 0.0–0.5)
Eosinophils Relative: 2 %
HEMATOCRIT: 37.8 % (ref 36.0–46.0)
Hemoglobin: 12 g/dL (ref 12.0–15.0)
IMMATURE GRANULOCYTES: 0 %
Lymphocytes Relative: 33 %
Lymphs Abs: 3.3 10*3/uL (ref 0.7–4.0)
MCH: 25.3 pg — AB (ref 26.0–34.0)
MCHC: 31.7 g/dL (ref 30.0–36.0)
MCV: 79.7 fL — AB (ref 80.0–100.0)
Monocytes Absolute: 0.6 10*3/uL (ref 0.1–1.0)
Monocytes Relative: 6 %
NEUTROS PCT: 58 %
Neutro Abs: 6 10*3/uL (ref 1.7–7.7)
Platelets: 283 10*3/uL (ref 150–400)
RBC: 4.74 MIL/uL (ref 3.87–5.11)
RDW: 14.3 % (ref 11.5–15.5)
WBC: 10.1 10*3/uL (ref 4.0–10.5)
nRBC: 0 % (ref 0.0–0.2)

## 2018-03-28 LAB — POCT PREGNANCY, URINE: Preg Test, Ur: NEGATIVE

## 2018-03-28 NOTE — ED Triage Notes (Signed)
Pt arrives ambulatory with steady gait to triage with c/o numbness around her mouth which started at 0000 Saturday morning. Pt has no visible neuro deficits at this time. Pt is in NAD.

## 2018-03-29 ENCOUNTER — Emergency Department
Admission: EM | Admit: 2018-03-29 | Discharge: 2018-03-29 | Disposition: A | Discharge status: Home / Self Care | Payer: PRIVATE HEALTH INSURANCE | Attending: Emergency Medicine | Admitting: Emergency Medicine

## 2018-03-29 ENCOUNTER — Emergency Department: Payer: PRIVATE HEALTH INSURANCE

## 2018-03-29 DIAGNOSIS — R200 Anesthesia of skin: Secondary | ICD-10-CM

## 2018-03-29 NOTE — ED Provider Notes (Signed)
[facility] Emergency Department Provider Note   ____________________________________________   First MD Initiated Contact with Patient 03/29/18 [phone]     (approximate)  I have reviewed the triage vital signs and the nursing notes.   HISTORY  Chief Complaint Numbness    HPI Shirley Stewart is a 38 y.o. female who presents to the ED from home with a chief complaint of numbness around her mouth.  Reports onset of lower lip numbness approximately 27 hours ago.  Over the day now both her lower and upper lips feel numb.  Denies weakness, facial droop, eye tearing, slurred speech, headache, neck pain, extremity weakness/numbness/tingling.  Denies chest pain, shortness of breath, abdominal pain, nausea, vomiting or diarrhea.  Denies recent travel or trauma.  Denies anticoagulant use.  On 03/16/2018 patient did have a initial visit with rheumatology for positive ANA antibodies, dry mouth; being worked up for lupus and Sjogren's disease.  She was prescribed Evoxac for dry mouth but admits she has not filled her prescription.  Denies new foods, medicines or environmental exposures which may cause her mouth to go numb.  Denies exposure to lidocaine or oral numbing agents.  Denies exposure to condoms which may have numbing lubricants.   Past Medical History:  Diagnosis Date  . GERD (gastroesophageal reflux disease)   . Hypertension     There are no active problems to display for this patient.   Past Surgical History:  Procedure Laterality Date  . TUBAL LIGATION      Prior to Admission medications   Medication Sig Start Date End Date Taking? Authorizing Provider  alum & mag hydroxide-simeth (MAALOX MAX) 400-400-40 MG/5ML suspension Take 10 mLs by mouth every 4 (four) hours as needed for indigestion. Mix with lidocaine and swallow 12/03/17   Cuthriell, Delorise RoyalsJonathan D, PA-C  aspirin EC 325 MG tablet Take 650 mg by mouth every 6 (six) hours as needed for mild pain.     [provider]  aspirin-sod bicarb-citric acid (ALKA-SELTZER) 325 MG TBEF tablet Take 325 mg by mouth every 6 (six) hours as needed (indigestion).    [provider]  cyclobenzaprine (FLEXERIL) 10 MG tablet Take 1 tablet (10 mg total) by mouth 2 (two) times daily as needed for muscle spasms. 02/13/18   Donnetta Hutchingook, Brian, MD  D3-50 50000 units capsule Take 50,000 Units by mouth once a week. 08/26/17   [provider]  diclofenac (VOLTAREN) 50 MG EC tablet Take 1 tablet (50 mg total) by mouth 2 (two) times daily. Prn pain 02/13/18   Donnetta Hutchingook, Brian, MD  hydrochlorothiazide (HYDRODIURIL) 12.5 MG tablet Take 2 tablets (25 mg total) by mouth daily. 04/24/17   Merrily Brittleifenbark, Neil, MD  lidocaine (XYLOCAINE) 2 % solution Use as directed 10 mLs in the mouth or throat every 4 (four) hours as needed for mouth pain. Add with maalox and swallow 12/03/17   Cuthriell, Delorise RoyalsJonathan D, PA-C    Allergies Patient has no known allergies.  No family history on file.  Social History Social History   Tobacco Use  . Smoking status: Former Smoker    Packs/day: 1.00    Types: Cigarettes    Last attempt to quit: 07/28/2015    Years since quitting: 2.6  . Smokeless tobacco: Never Used  Substance Use Topics  . Alcohol use: No  . Drug use: No    Review of Systems  Constitutional: No fever/chills Eyes: No visual changes. ENT: Positive for perioral numbness.  No sore throat. Cardiovascular: Denies chest pain. Respiratory:  Denies shortness of breath. Gastrointestinal: No abdominal pain.  No nausea, no vomiting.  No diarrhea.  No constipation. Genitourinary: Negative for dysuria. Musculoskeletal: Negative for back pain. Skin: Negative for rash. Neurological: Negative for headaches, focal weakness or numbness.   ____________________________________________   PHYSICAL EXAM:  VITAL SIGNS: ED Triage Vitals  Enc Vitals Group     BP 03/28/18 2307 (!) 144/95     Pulse Rate 03/28/18 2307 90     Resp  03/28/18 2307 20     Temp 03/28/18 2307 (!) 97.5 F (36.4 C)     Temp Source 03/28/18 2307 Oral     SpO2 03/28/18 2307 100 %     Weight 03/28/18 2307 195 lb (88.5 kg)     Height 03/28/18 2307 5' (1.524 m)     Head Circumference --      Peak Flow --      Pain Score 03/28/18 2312 8     Pain Loc --      Pain Edu? --      Excl. in GC? --     Constitutional: Alert and oriented. Well appearing and in no acute distress. Eyes: Conjunctivae are normal. PERRL. EOMI. Head: Atraumatic. Nose: No congestion/rhinnorhea. Mouth/Throat: Mucous membranes are moist. No external ulcers, blisters or vesicles. Oropharynx non-erythematous. Neck: No stridor.  No carotid bruits. Cardiovascular: Normal rate, regular rhythm. Grossly normal heart sounds.  Good peripheral circulation. Respiratory: Normal respiratory effort.  No retractions. Lungs CTAB. Gastrointestinal: Soft and nontender. No distention. No abdominal bruits. No CVA tenderness. Musculoskeletal: No lower extremity tenderness nor edema.  No joint effusions. Neurologic:  Alert and oriented x 3. CN II-XII grossly intact. Normal speech and language. No gross focal neurologic deficits are appreciated. No gait instability. Skin:  Skin is warm, dry and intact. No rash noted. Psychiatric: Mood and affect are normal. Speech and behavior are normal.  ____________________________________________   LABS (all labs ordered are listed, but only abnormal results are displayed)  Labs Reviewed  CBC WITH DIFFERENTIAL/PLATELET - Abnormal; Notable for the following components:      Result Value   MCV 79.7 (*)    MCH 25.3 (*)    All other components within normal limits  BASIC METABOLIC PANEL - Abnormal; Notable for the following components:   Sodium 134 (*)    All other components within normal limits  URINALYSIS, COMPLETE (UACMP) WITH MICROSCOPIC - Abnormal; Notable for the following components:   Color, Urine STRAW (*)    APPearance CLEAR (*)    All  other components within normal limits  POCT PREGNANCY, URINE   ____________________________________________  EKG  None ____________________________________________  RADIOLOGY  ED MD interpretation: No ICH  Official radiology report(s): Ct Head Wo Contrast  Result Date: 03/29/2018 CLINICAL DATA:  Initial evaluation for acute perioral numbness. EXAM: CT HEAD WITHOUT CONTRAST TECHNIQUE: Contiguous axial images were obtained from the base of the skull through the vertex without intravenous contrast. COMPARISON:  Prior CT from 01/22/2018. FINDINGS: Brain: Cerebral volume within normal limits for patient age. No evidence for acute intracranial hemorrhage. No findings to suggest acute large vessel territory infarct. No mass lesion, midline shift, or mass effect. Ventricles are normal in size without evidence for hydrocephalus. No extra-axial fluid collection identified. Vascular: No hyperdense vessel identified. Skull: Scalp soft tissues demonstrate no acute abnormality. Calvarium intact. Sinuses/Orbits: Globes and orbital soft tissues within normal limits. Visualized paranasal sinuses are clear. No mastoid effusion. IMPRESSION: Normal head CT.  No acute intracranial abnormality identified. Electronically Signed  By: Rise Mu M.D.   On: 03/29/2018 04:23    ____________________________________________   PROCEDURES  Procedure(s) performed:   NIH Stroke Scale  Interval: Baseline Time: 5:16 AM Person Administering Scale: , J  Administer stroke scale items in the order listed. Record performance in each category after each subscale exam. Do not go back and change scores. Follow directions provided for each exam technique. Scores should reflect what the patient does, not what the clinician thinks the patient can do. The clinician should record answers while administering the exam and work quickly. Except where indicated, the patient should not be coached (i.e., repeated  requests to patient to make a special effort).   1a  Level of consciousness: 0=alert; keenly responsive  1b. LOC questions:  0=Performs both tasks correctly  1c. LOC commands: 0=Performs both tasks correctly  2.  Best Gaze: 0=normal  3.  Visual: 0=No visual loss  4. Facial Palsy: 0=Normal symmetric movement  5a.  Motor left arm: 0=No drift, limb holds 90 (or 45) degrees for full 10 seconds  5b.  Motor right arm: 0=No drift, limb holds 90 (or 45) degrees for full 10 seconds  6a. motor left leg: 0=No drift, limb holds 90 (or 45) degrees for full 10 seconds  6b  Motor right leg:  0=No drift, limb holds 90 (or 45) degrees for full 10 seconds  7. Limb Ataxia: 0=Absent  8.  Sensory: 0=Normal; no sensory loss  9. Best Language:  0=No aphasia, normal  10. Dysarthria: 0=Normal  11. Extinction and Inattention: 0=No abnormality  12. Distal motor function: 0=Normal   Total:   0    Procedures  Critical Care performed: No  ____________________________________________   INITIAL IMPRESSION / ASSESSMENT AND PLAN / ED COURSE  As part of my medical decision making, I reviewed the following data within the electronic MEDICAL RECORD NUMBER Nursing notes reviewed and incorporated, Labs reviewed, Old chart reviewed, Radiograph reviewed  and Notes from prior ED visits   38 year old female who presents with perioral numbness.  Differential diagnosis includes but is not limited to CVA, cranial nerve VII dysfunction, autoimmune disease, environmental exposures, trauma, etc.  Laboratory results unremarkable.  Will obtain CT head to evaluate for CVA/intracranial hemorrhage.  There is no tongue or lip swelling.  There are no hives.  Low suspicion for allergic reaction.   Clinical Course as of Mar 29 514  Sun Mar 29, 2018  0515 Updated patient of negative CT head.  Advised Chapstick, heat and follow-up with her PCP.  Strict return precautions given.  Patient verbalizes understanding agrees with plan of care.     [JS]    Clinical Course User Index [JS] Irean Hong, MD     ____________________________________________   FINAL CLINICAL IMPRESSION(S) / ED DIAGNOSES  Final diagnoses:  Perioral numbness     ED Discharge Orders    None       Note:  This document was prepared using Dragon voice recognition software and may include unintentional dictation errors.    Irean Hong, MD 03/29/18 [phone]

## 2018-03-29 NOTE — ED Notes (Signed)
Pt reports "pins and needles" sensation to lips

## 2018-03-29 NOTE — Discharge Instructions (Addendum)
Apply Chapstick to keep the lips moist.  Try moist heat instead of ice for symptomatic relief.  Return to the ER for worsening symptoms, swelling, tongue swelling, hives, difficulty breathing or other concerns.

## 2018-03-29 NOTE — ED Notes (Signed)
No peripheral IV placed this visit.   Discharge instructions reviewed with patient. Questions fielded by this RN. Patient verbalizes understanding of instructions. Patient discharged home in stable condition per sung. No acute distress noted at time of discharge.   

## 2018-03-29 NOTE — ED Notes (Signed)
Up to stat desk inquiring about wait time, update given, verbalized understanding.

## 2018-07-18 ENCOUNTER — Emergency Department: Payer: PRIVATE HEALTH INSURANCE

## 2018-07-18 ENCOUNTER — Emergency Department
Admission: EM | Admit: 2018-07-18 | Discharge: 2018-07-18 | Disposition: A | Discharge status: Home / Self Care | Payer: PRIVATE HEALTH INSURANCE | Attending: Emergency Medicine | Admitting: Emergency Medicine

## 2018-07-18 ENCOUNTER — Other Ambulatory Visit: Payer: Self-pay

## 2018-07-18 DIAGNOSIS — I10 Essential (primary) hypertension: Secondary | ICD-10-CM | POA: Insufficient documentation

## 2018-07-18 DIAGNOSIS — R102 Pelvic and perineal pain: Secondary | ICD-10-CM | POA: Insufficient documentation

## 2018-07-18 DIAGNOSIS — Z87891 Personal history of nicotine dependence: Secondary | ICD-10-CM | POA: Diagnosis not present

## 2018-07-18 LAB — URINALYSIS, COMPLETE (UACMP) WITH MICROSCOPIC
Bacteria, UA: NONE SEEN
Bilirubin Urine: NEGATIVE
Glucose, UA: NEGATIVE mg/dL
Hgb urine dipstick: NEGATIVE
Ketones, ur: NEGATIVE mg/dL
Leukocytes,Ua: NEGATIVE
Nitrite: NEGATIVE
Protein, ur: NEGATIVE mg/dL
Specific Gravity, Urine: 1.012 (ref 1.005–1.030)
pH: 5 (ref 5.0–8.0)

## 2018-07-18 LAB — PREGNANCY, URINE: Preg Test, Ur: NEGATIVE

## 2018-07-18 LAB — COMPREHENSIVE METABOLIC PANEL
ALT: 12 U/L (ref 0–44)
AST: 17 U/L (ref 15–41)
Albumin: 4.3 g/dL (ref 3.5–5.0)
Alkaline Phosphatase: 84 U/L (ref 38–126)
Anion gap: 8 (ref 5–15)
BUN: 7 mg/dL (ref 6–20)
CO2: 26 mmol/L (ref 22–32)
Calcium: 9 mg/dL (ref 8.9–10.3)
Chloride: 104 mmol/L (ref 98–111)
Creatinine, Ser: 0.83 mg/dL (ref 0.44–1.00)
GFR calc Af Amer: >60 mL/min (ref >60)
GFR calc non Af Amer: >60 mL/min (ref >60)
Glucose, Bld: 112 mg/dL — ABNORMAL HIGH (ref 70–99)
Potassium: 3.6 mmol/L (ref 3.5–5.1)
Sodium: 138 mmol/L (ref 135–145)
Total Bilirubin: 0.5 mg/dL (ref 0.3–1.2)
Total Protein: 7.3 g/dL (ref 6.5–8.1)

## 2018-07-18 LAB — CHLAMYDIA/NGC RT PCR (ARMC ONLY)
Chlamydia Tr: NOT DETECTED
N gonorrhoeae: NOT DETECTED

## 2018-07-18 LAB — WET PREP, GENITAL
Clue Cells Wet Prep HPF POC: NONE SEEN
Sperm: NONE SEEN
Trich, Wet Prep: NONE SEEN
Yeast Wet Prep HPF POC: NONE SEEN

## 2018-07-18 LAB — CBC
HCT: 35.5 % — ABNORMAL LOW (ref 36.0–46.0)
Hemoglobin: 11.4 g/dL — ABNORMAL LOW (ref 12.0–15.0)
MCH: 25.3 pg — ABNORMAL LOW (ref 26.0–34.0)
MCHC: 32.1 g/dL (ref 30.0–36.0)
MCV: 78.9 fL — ABNORMAL LOW (ref 80.0–100.0)
Platelets: 257 10*3/uL (ref 150–400)
RBC: 4.5 MIL/uL (ref 3.87–5.11)
RDW: 14.9 % (ref 11.5–15.5)
WBC: 7.9 10*3/uL (ref 4.0–10.5)
nRBC: 0 % (ref 0.0–0.2)

## 2018-07-18 LAB — LIPASE, BLOOD: Lipase: 25 U/L (ref 11–51)

## 2018-07-18 MED ORDER — SODIUM CHLORIDE 0.9% FLUSH
3.0000 mL | Freq: Once | INTRAVENOUS | Status: AC
  Administered 2018-07-18: 17:00:00 3 mL via INTRAVENOUS

## 2018-07-18 MED ORDER — MORPHINE SULFATE (PF) 4 MG/ML IV SOLN: 4.0000 mg | Freq: Once | INTRAVENOUS | Status: DC

## 2018-07-18 MED ORDER — IBUPROFEN 800 MG PO TABS: 800.0000 mg | ORAL_TABLET | Freq: Three times a day (TID) | ORAL | 0 refills | Status: DC | PRN

## 2018-07-18 MED ORDER — METRONIDAZOLE 500 MG PO TABS: 500.0000 mg | ORAL_TABLET | Freq: Two times a day (BID) | ORAL | 0 refills | Status: DC

## 2018-07-18 MED ORDER — AZITHROMYCIN 500 MG PO TABS
1000.0000 mg | ORAL_TABLET | Freq: Once | ORAL | Status: DC
  Filled 2018-07-18: qty 2

## 2018-07-18 MED ORDER — KETOROLAC TROMETHAMINE 30 MG/ML IJ SOLN
30.0000 mg | Freq: Once | INTRAMUSCULAR | Status: AC
  Administered 2018-07-18: 18:00:00 30 mg via INTRAVENOUS
  Filled 2018-07-18: qty 1

## 2018-07-18 MED ORDER — ONDANSETRON HCL 4 MG/2ML IJ SOLN: 4.0000 mg | Freq: Once | INTRAMUSCULAR | Status: DC

## 2018-07-18 MED ORDER — SODIUM CHLORIDE 0.9 % IV SOLN
1.0000 g | Freq: Once | INTRAVENOUS | Status: AC
  Administered 2018-07-18: 1 g via INTRAVENOUS
  Filled 2018-07-18: qty 10

## 2018-07-18 MED ORDER — DOXYCYCLINE HYCLATE 100 MG PO CAPS: 100.0000 mg | ORAL_CAPSULE | Freq: Two times a day (BID) | ORAL | 0 refills | Status: DC

## 2018-07-18 NOTE — ED Notes (Signed)
Pt given 2 warm blankets

## 2018-07-18 NOTE — ED Provider Notes (Signed)
Patient declined the azithromycin because of issues she has had with it in the past.   Emily Filbert, MD 07/18/18 [phone]

## 2018-07-18 NOTE — ED Provider Notes (Signed)
[facility] Emergency Department Provider Note       Time seen: ----------------------------------------- 4:35 PM on 07/18/2018 -----------------------------------------   I have reviewed the triage vital signs and the nursing notes.  HISTORY   Chief Complaint Abdominal Pain    HPI Shirley Stewart is a 38 y.o. female with a history of GERD, hypertension who presents to the ED for lower abdominal pain and pelvic pain for couple days.  Patient reports worsening sharp pains.  Does report some vaginal discharge and bleeding but nothing out of the ordinary.  She denies fevers, chills, vomiting, diarrhea or dysuria.  Past Medical History:  Diagnosis Date  . GERD (gastroesophageal reflux disease)   . Hypertension     There are no active problems to display for this patient.   Past Surgical History:  Procedure Laterality Date  . TUBAL LIGATION      Allergies Patient has no known allergies.  Social History Social History   Tobacco Use  . Smoking status: Former Smoker    Packs/day: 1.00    Types: Cigarettes    Last attempt to quit: 07/28/2015    Years since quitting: 2.9  . Smokeless tobacco: Never Used  Substance Use Topics  . Alcohol use: No  . Drug use: No   Review of Systems Constitutional: Negative for fever. Cardiovascular: Negative for chest pain. Respiratory: Negative for shortness of breath. Gastrointestinal: Negative for abdominal pain, vomiting and diarrhea. Genitourinary: Positive for pelvic pain Musculoskeletal: Negative for back pain. Skin: Negative for rash. Neurological: Negative for headaches, focal weakness or numbness.  All systems negative/normal/unremarkable except as stated in the HPI  ____________________________________________   PHYSICAL EXAM:  VITAL SIGNS: ED Triage Vitals  Enc Vitals Group     BP 07/18/18 1630 (!) 154/75     Pulse Rate 07/18/18 1630 (!) 103     Resp 07/18/18 1630 14     Temp 07/18/18 1630  97.7 F (36.5 C)     Temp Source 07/18/18 1630 Oral     SpO2 07/18/18 1630 100 %     Weight 07/18/18 1631 185 lb (83.9 kg)     Height --      Head Circumference --      Peak Flow --      Pain Score 07/18/18 1630 8     Pain Loc --      Pain Edu? --      Excl. in GC? --    Constitutional: Alert and oriented.  Mild distress from pain Eyes: Conjunctivae are normal. Normal extraocular movements. Cardiovascular: Normal rate, regular rhythm. No murmurs, rubs, or gallops. Respiratory: Normal respiratory effort without tachypnea nor retractions. Breath sounds are clear and equal bilaterally. No wheezes/rales/rhonchi. Gastrointestinal: Lower abdominal and pelvic tenderness, no rebound or guarding.  Normal bowel sounds. Genitourinary: Exquisite cervical motion tenderness with positive chandelier sign, white vaginal discharge, bilateral adnexal tenderness Musculoskeletal: Nontender with normal range of motion in extremities. No lower extremity tenderness nor edema. Neurologic:  Normal speech and language. No gross focal neurologic deficits are appreciated.  Skin:  Skin is warm, dry and intact. No rash noted. Psychiatric: Mood and affect are normal. Speech and behavior are normal.  ____________________________________________  ED COURSE:  As part of my medical decision making, I reviewed the following data within the electronic MEDICAL RECORD NUMBER History obtained from family if available, nursing notes, old chart and ekg, as well as notes from prior ED visits. Patient presented for pelvic pain, we will assess with labs and  imaging as indicated at this time.   Procedures  Shirley Stewart was evaluated in Emergency Department on 07/18/2018 for the symptoms described in the history of present illness. She was evaluated in the context of the global COVID-19 pandemic, which necessitated consideration that the patient might be at risk for infection with the SARS-CoV-2 virus that causes COVID-19.  Institutional protocols and algorithms that pertain to the evaluation of patients at risk for COVID-19 are in a state of rapid change based on information released by regulatory bodies including the CDC and federal and state organizations. These policies and algorithms were followed during the patient's care in the ED.  ____________________________________________   LABS (pertinent positives/negatives)  Labs Reviewed  WET PREP, GENITAL - Abnormal; Notable for the following components:      Result Value   WBC, Wet Prep HPF POC FEW (*)    All other components within normal limits  COMPREHENSIVE METABOLIC PANEL - Abnormal; Notable for the following components:   Glucose, Bld 112 (*)    All other components within normal limits  CBC - Abnormal; Notable for the following components:   Hemoglobin 11.4 (*)    HCT 35.5 (*)    MCV 78.9 (*)    MCH 25.3 (*)    All other components within normal limits  URINALYSIS, COMPLETE (UACMP) WITH MICROSCOPIC - Abnormal; Notable for the following components:   Color, Urine STRAW (*)    APPearance CLEAR (*)    All other components within normal limits  CHLAMYDIA/NGC RT PCR ([facility] ONLY)  LIPASE, BLOOD  PREGNANCY, URINE    RADIOLOGY Images were viewed by me  Pelvic ultrasound  IMPRESSION: 1. Negative for ovarian torsion. 2. Trace free fluid in the pelvis  ____________________________________________   DIFFERENTIAL DIAGNOSIS   PID, STD, ovarian cyst, torsion, constipation, UTI  FINAL ASSESSMENT AND PLAN  Pelvic pain   Plan: The patient had presented for pelvic pain. Patient's labs did not reveal any acute process although gonorrhea and Chlamydia test are pending. Patient's imaging was negative for torsion.  She did have trace free fluid in her pelvis.  She will be covered with Rocephin and Zithromax and given pain medicine.  I will advise close outpatient follow-up with her doctor.   Ulice Dash, MD    Note: This note  was generated in part or whole with voice recognition software. Voice recognition is usually quite accurate but there are transcription errors that can and very often do occur. I apologize for any typographical errors that were not detected and corrected.     Emily Filbert, MD 07/18/18 1929

## 2018-07-18 NOTE — ED Triage Notes (Signed)
Pt presents c/o lower abd pain and pelvic pain for a couple of days. Pt reports worsening sharp pains.

## 2018-07-18 NOTE — ED Notes (Signed)
Pt returned from US at this time.

## 2019-01-05 ENCOUNTER — Other Ambulatory Visit: Payer: Self-pay

## 2019-01-05 DIAGNOSIS — Z20822 Contact with and (suspected) exposure to covid-19: Secondary | ICD-10-CM

## 2019-01-07 LAB — NOVEL CORONAVIRUS, NAA: SARS-CoV-2, NAA: NOT DETECTED

## 2019-04-18 IMAGING — CT CT NECK W/ CM
4 of 5 series · 14 of 33 positions shown, 16 images · IV contrast (omnipaque)
Comparison: None.

CLINICAL DATA: Sore throat for 1 week

EXAM:
CT NECK WITH CONTRAST
TECHNIQUE: Multidetector CT imaging of the neck was performed using the
standard protocol following the bolus administration of intravenous
contrast.
CONTRAST:  75mL OMNIPAQUE IOHEXOL 300 MG/ML  SOLN

[Series 2: axial neck · axial · 0.52mm/px · z∈[-7,+45]mm · 2 of 104 slices shown]
[im 26/104  bone]
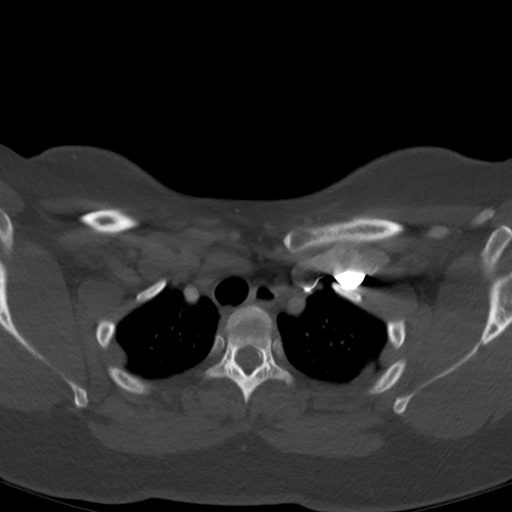
[im 52/104  bone]
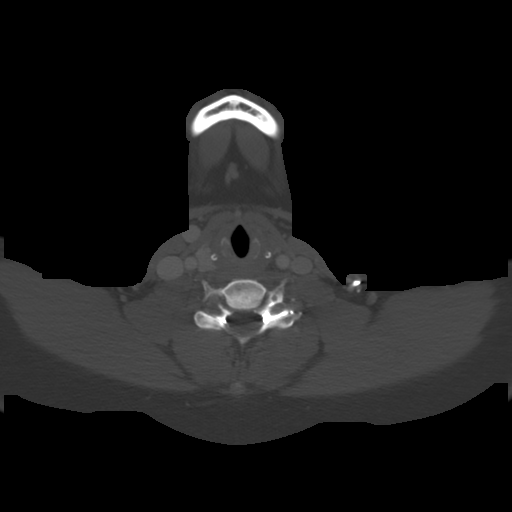

[Series 6: sag neck · sagittal · 0.43mm/px · 5 of 75 slices shown, 6 images]
[im 25/75  bone]
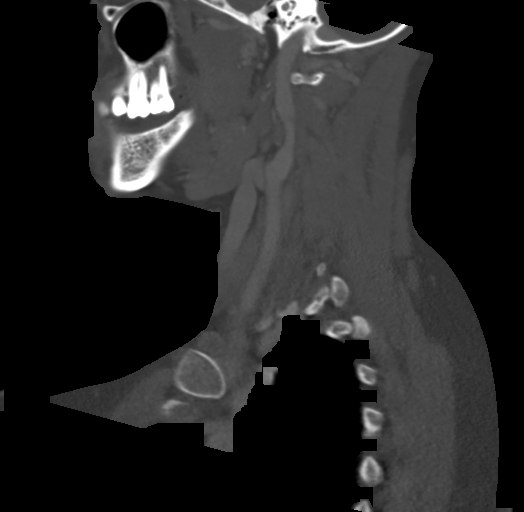
[im 31/75  bone]
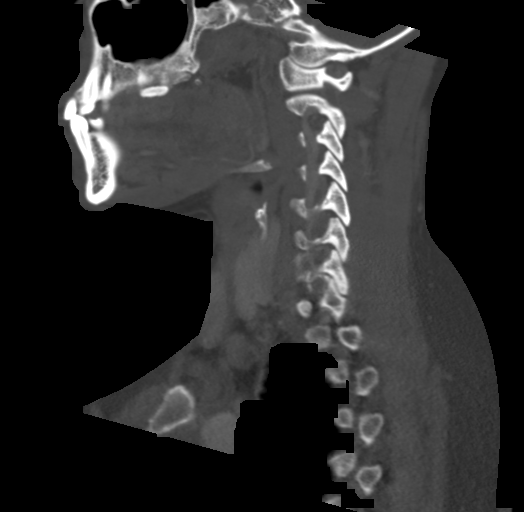
[im 38/75  soft-tissue]
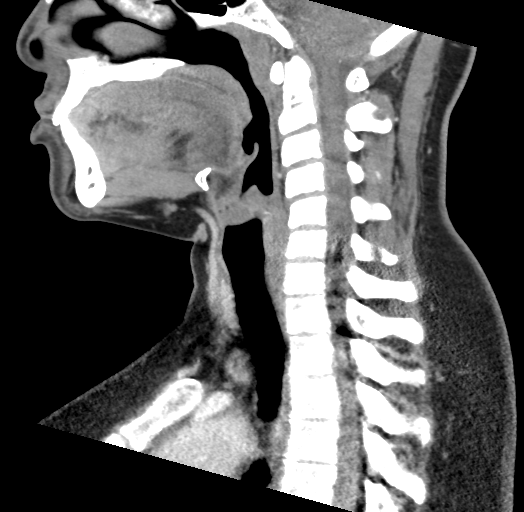
[im 38/75  bone]
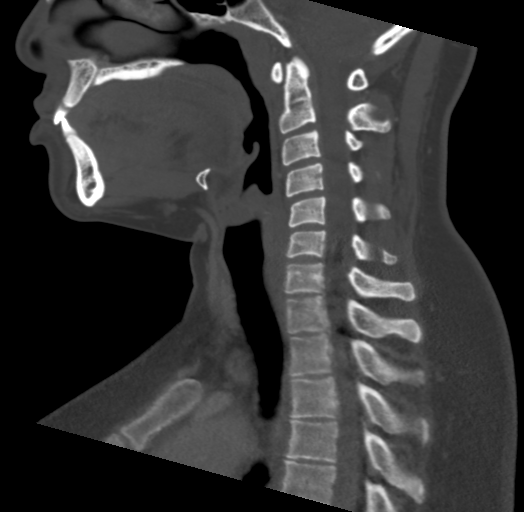
[im 44/75  bone]
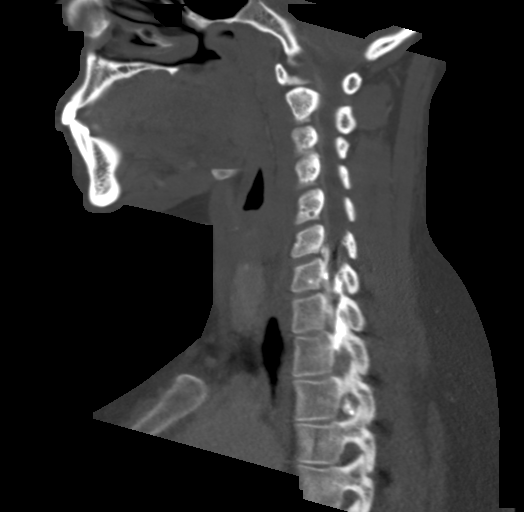
[im 50/75  bone]
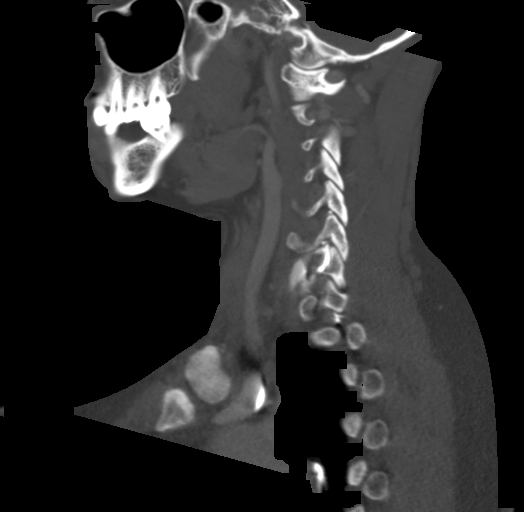

[Series 7: cor neck · coronal · 0.32mm/px · 3 of 90 slices shown]
[im 18/90  bone]
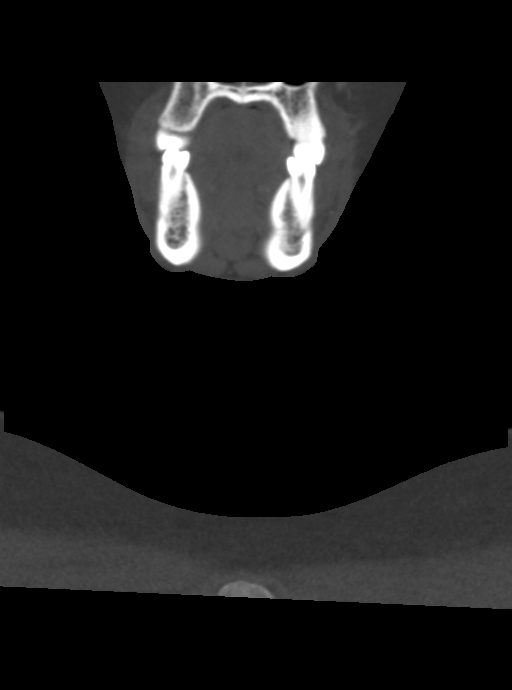
[im 36/90  bone]
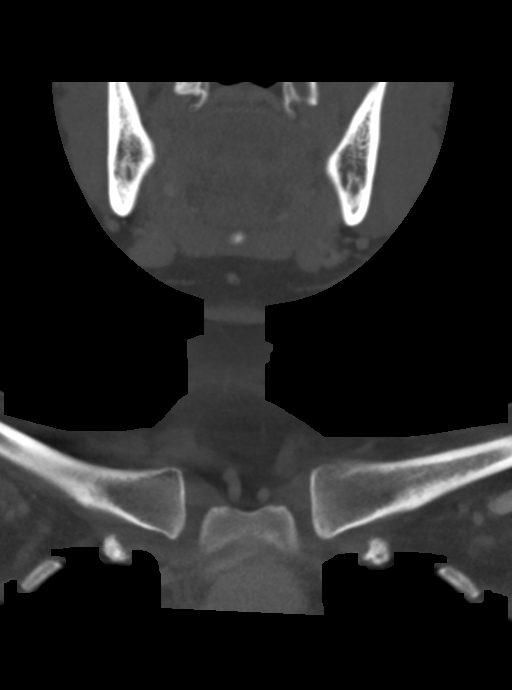
[im 54/90  bone]
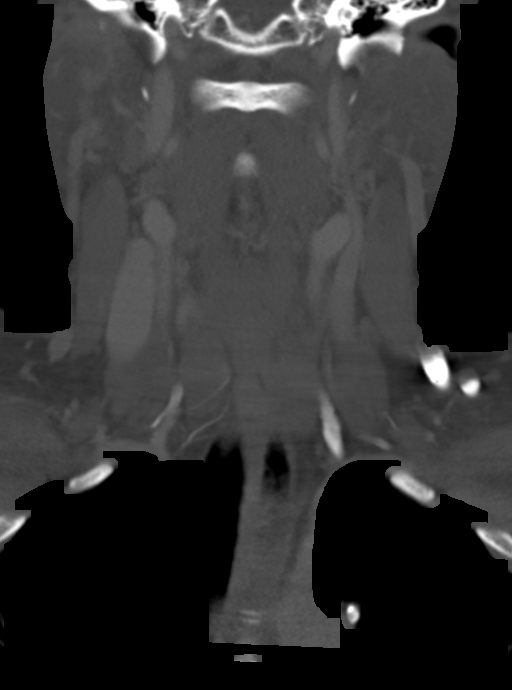

[Series 8: orthogonal ax · axial · 0.31mm/px · z∈[-52,+61]mm · 4 of 101 slices shown, 5 images]
[im 21/101  soft-tissue]
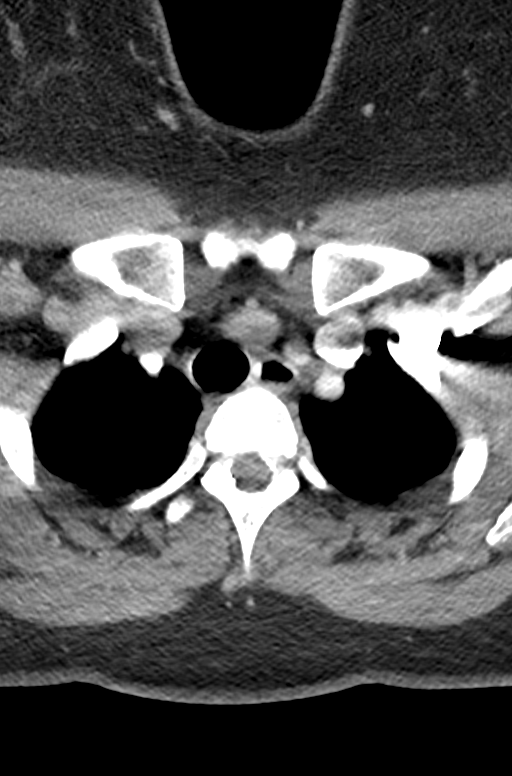
[im 21/101  bone]
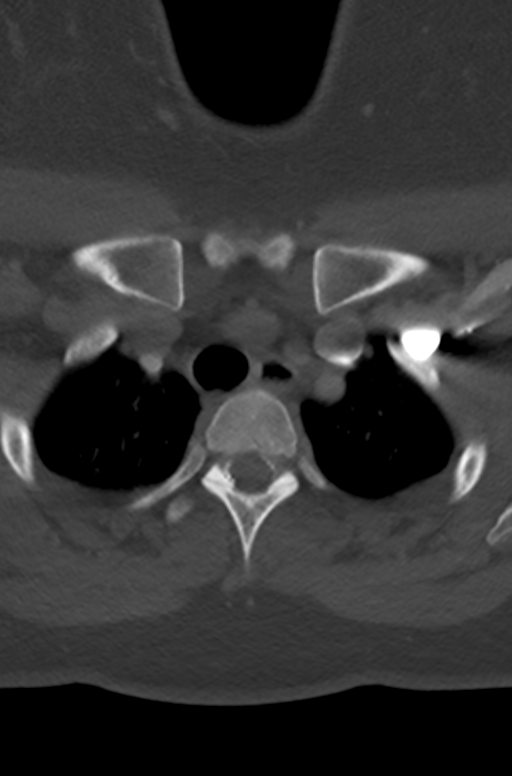
[im 41/101  bone]
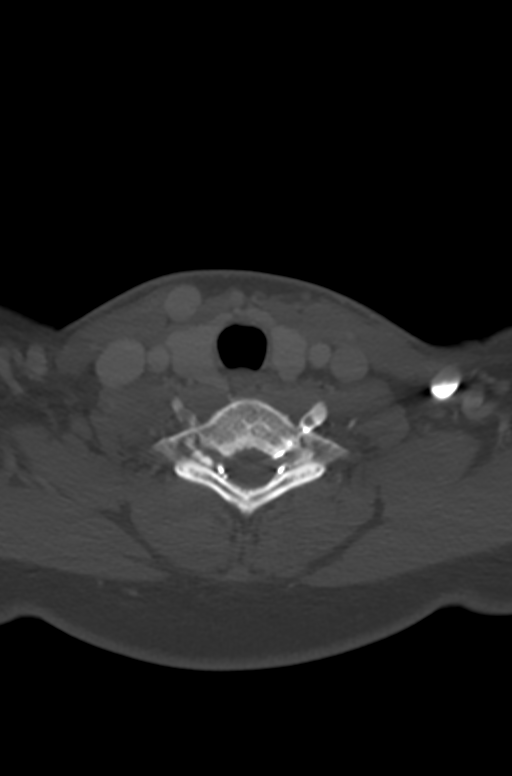
[im 61/101  bone]
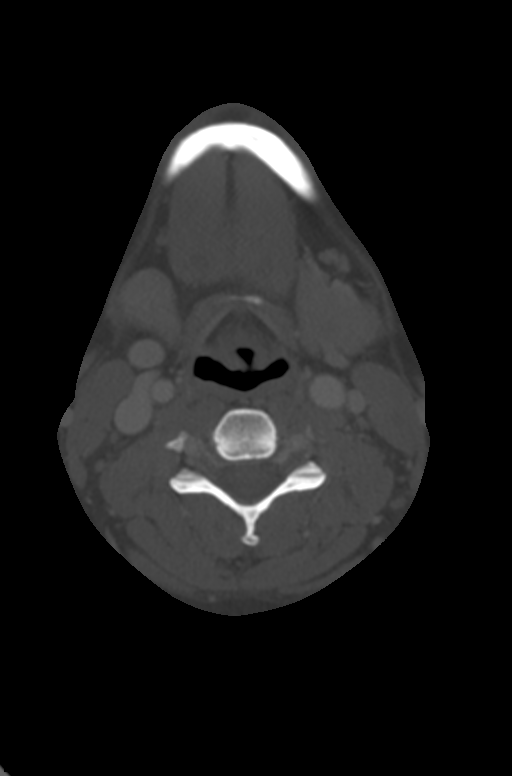
[im 81/101  bone]
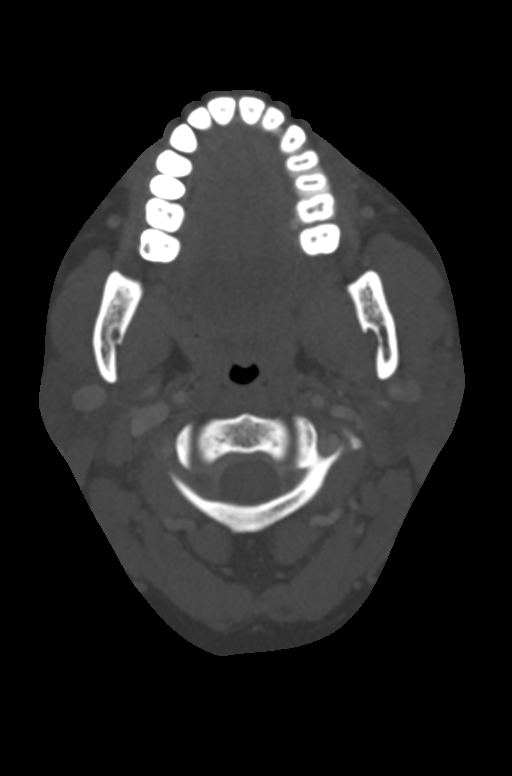

[14 of 33 positions shown; findings below may reference images not displayed]

FINDINGS: PHARYNX AND LARYNX:

--Nasopharynx: Fossae of [place] are clear. Normal adenoid
tonsils for age.

--Oral cavity and oropharynx: The palatine and lingual tonsils are
normal. The visible oral cavity and floor of mouth are normal.

--Hypopharynx: Normal vallecula and pyriform sinuses.

--Larynx: Normal epiglottis and pre-epiglottic space. Normal
aryepiglottic and vocal folds.

--Retropharyngeal space: No abscess, effusion or lymphadenopathy.

SALIVARY GLANDS:

--Parotid: No mass lesion or inflammation. No sialolithiasis or
ductal dilatation.

--Submandibular: Symmetric without inflammation. No sialolithiasis
or ductal dilatation.

--Sublingual: Normal. No ranula or other visible lesion of the base
of tongue and floor of mouth.

THYROID: Normal.

LYMPH NODES: No enlarged or abnormal density lymph nodes.

VASCULAR: Major cervical vessels are patent.

LIMITED INTRACRANIAL: Normal.

VISUALIZED ORBITS: Normal.

MASTOIDS AND VISUALIZED PARANASAL SINUSES: No fluid levels or
advanced mucosal thickening. No mastoid effusion.

SKELETON: No bony spinal canal stenosis. No lytic or blastic
lesions.

UPPER CHEST: Clear.

OTHER: None.
IMPRESSION: Normal CT of the neck.

## 2019-05-10 IMAGING — DX DG CHEST 2V
2 series · 2 of 2 positions shown · non-contrast
Comparison: 01/17/2018

CLINICAL DATA: Chest pain since [REDACTED].

EXAM:
CHEST - 2 VIEW

[chest pa]
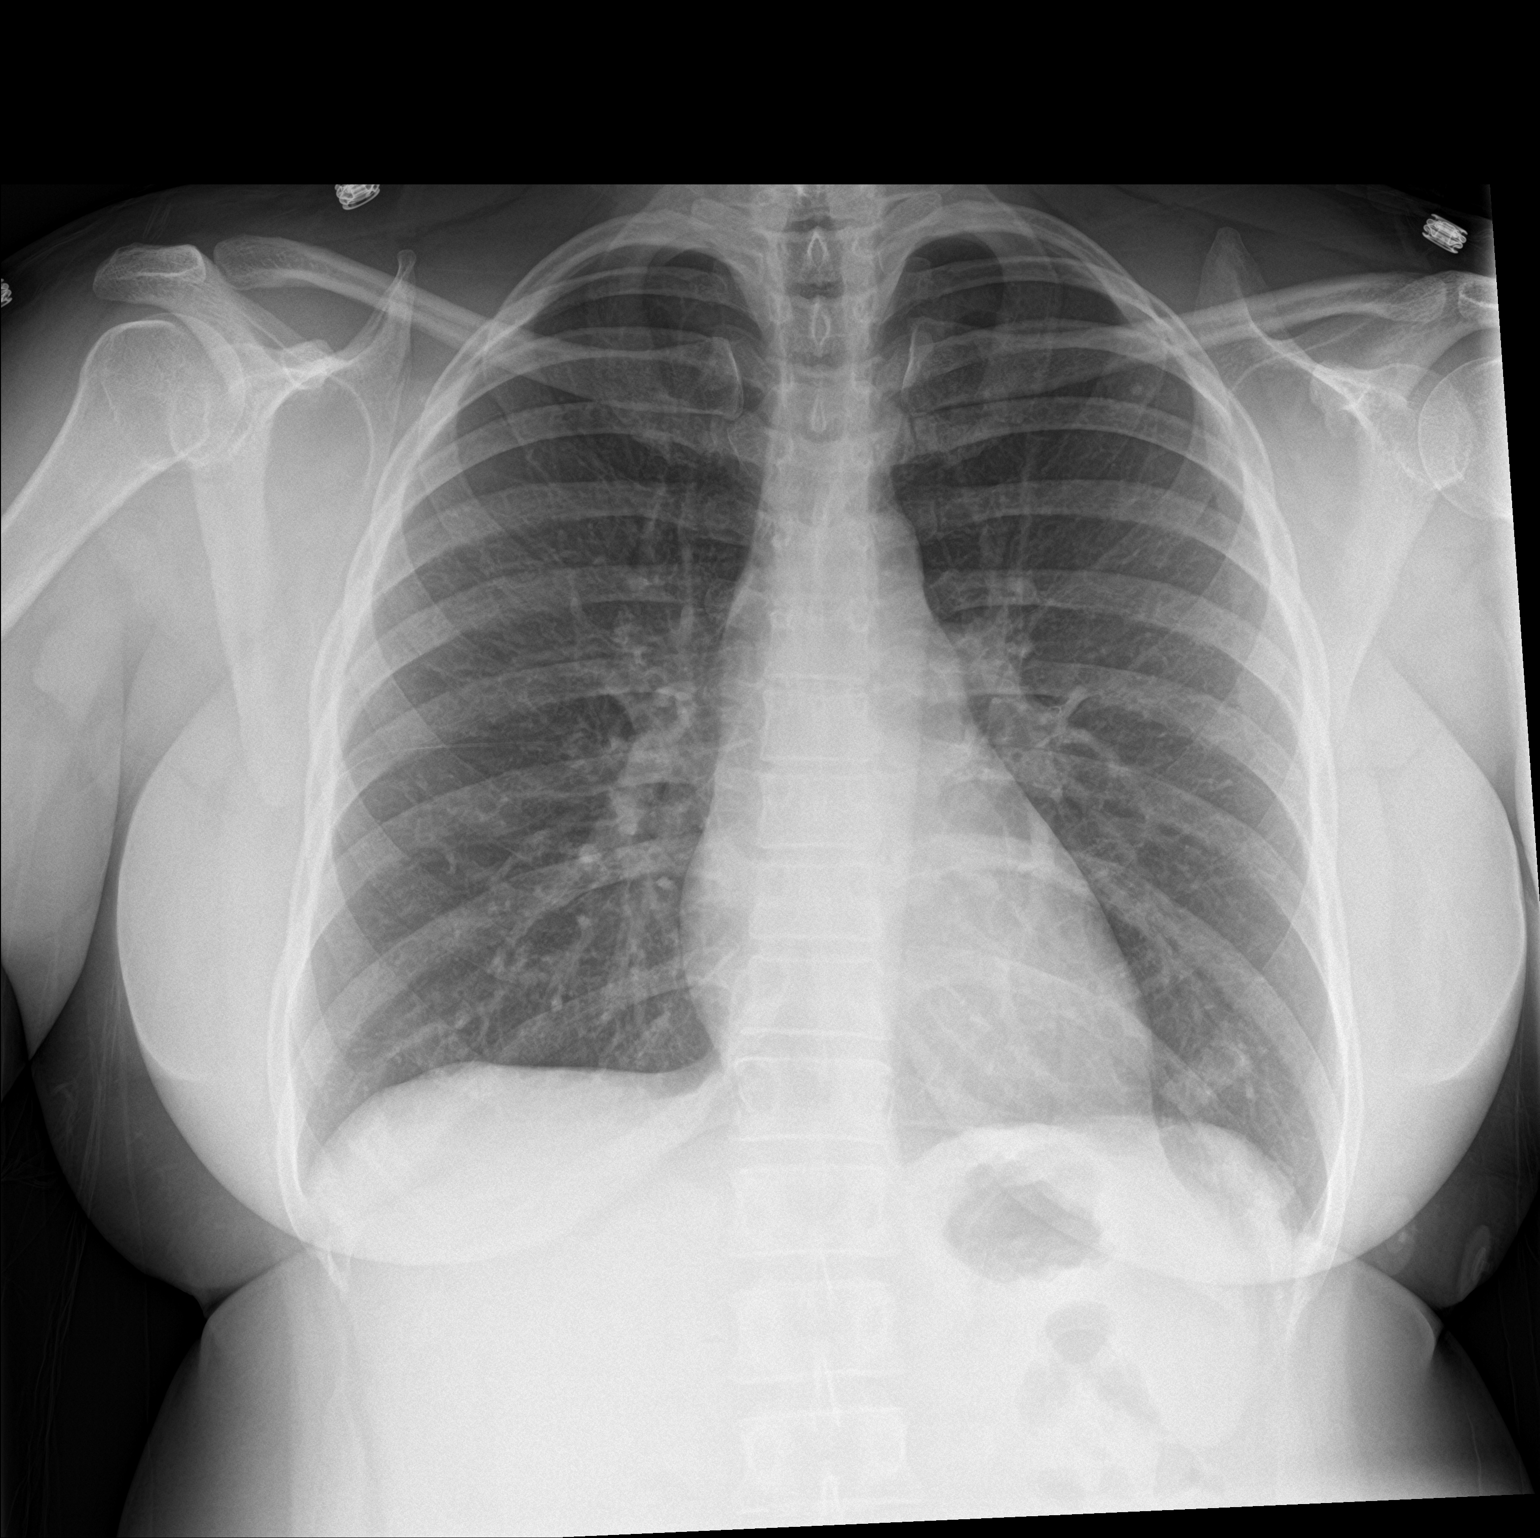

[chest lat]
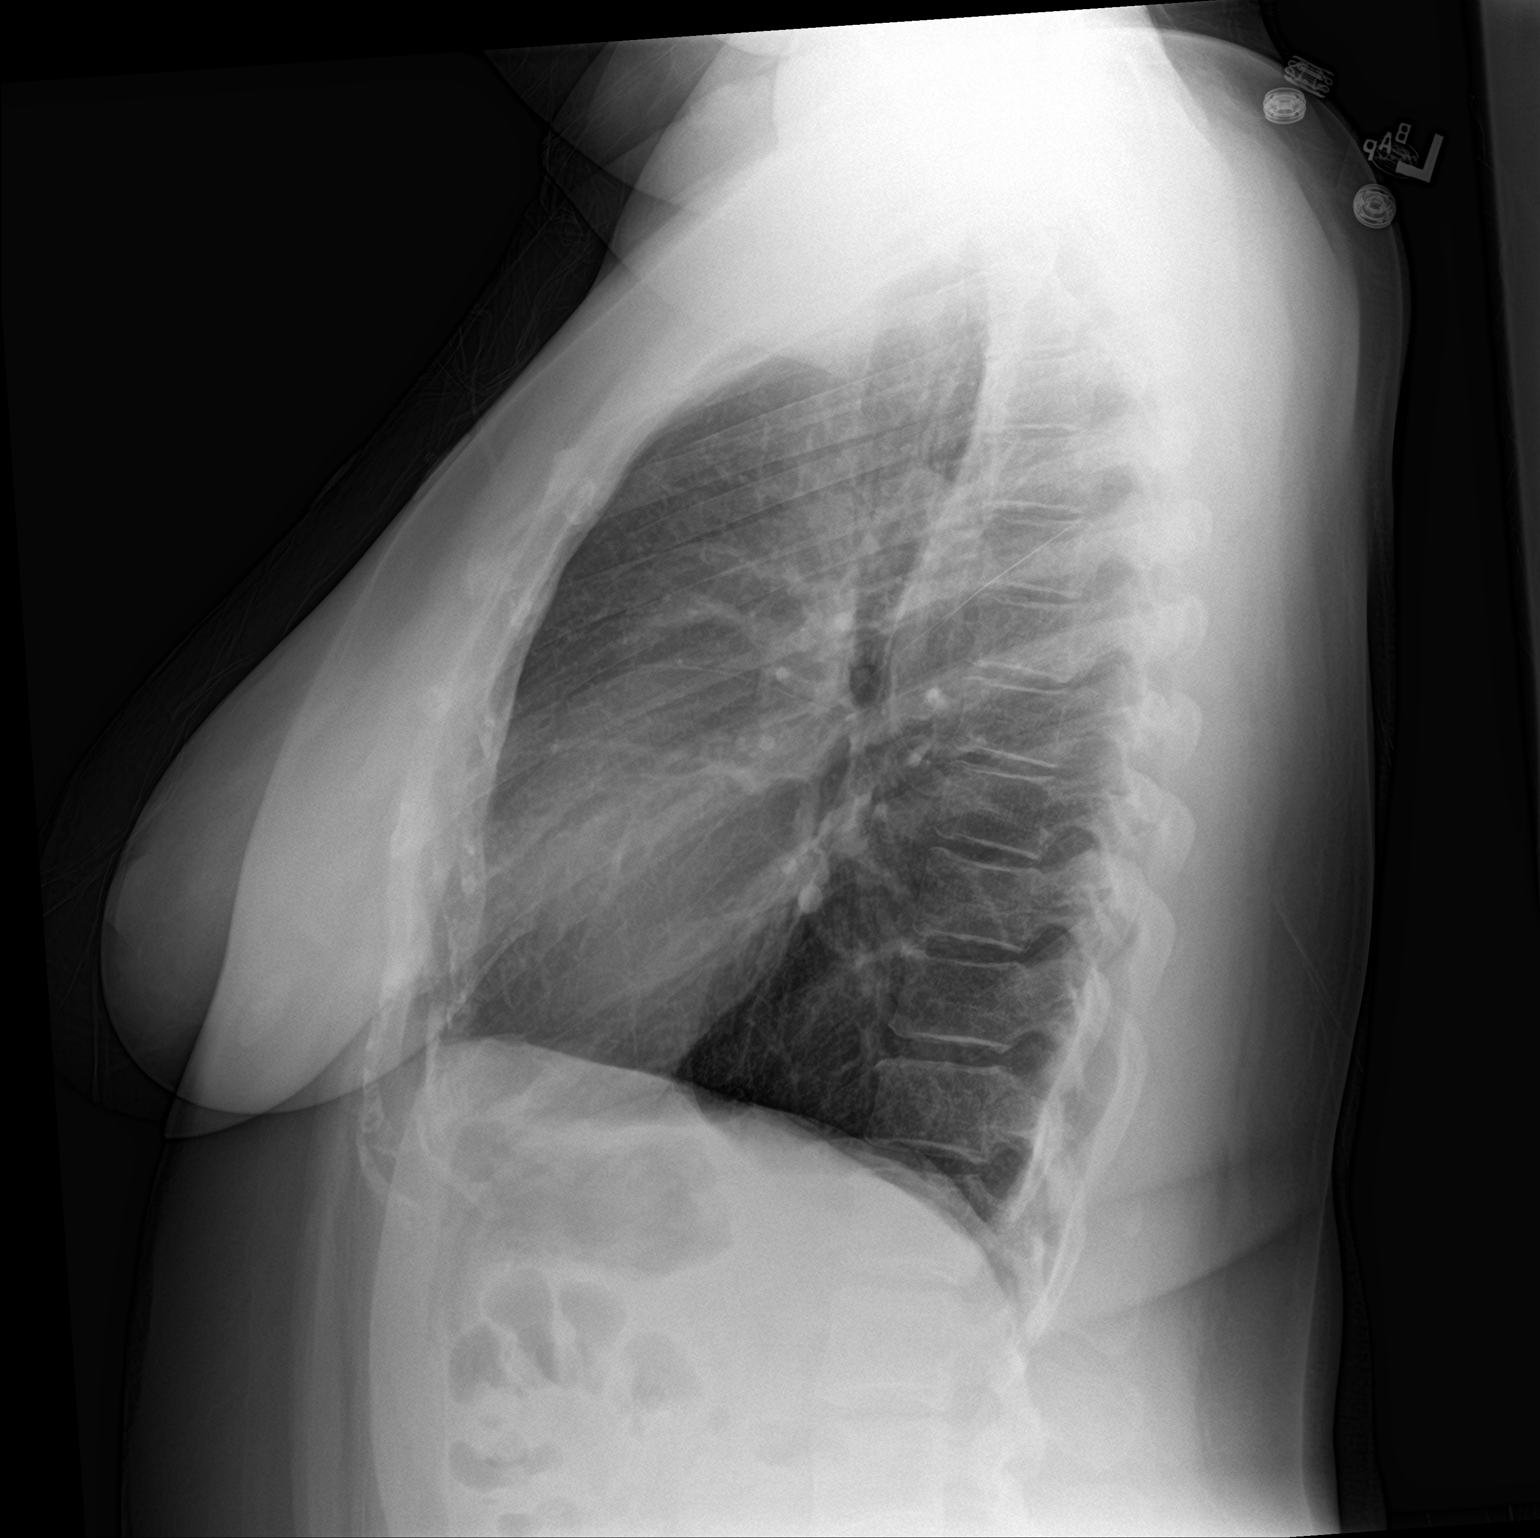

[2 of 2 positions shown; findings below may reference images not displayed]

FINDINGS: The heart size and mediastinal contours are within normal limits.
Both lungs are clear. The visualized skeletal structures are
unremarkable.
IMPRESSION: No active cardiopulmonary disease.

## 2019-06-22 ENCOUNTER — Other Ambulatory Visit: Payer: Self-pay | Admitting: Family Medicine

## 2019-06-22 ENCOUNTER — Ambulatory Visit: Admission: RE | Admit: 2019-06-22 | Payer: PRIVATE HEALTH INSURANCE | Source: Ambulatory Visit

## 2019-06-22 DIAGNOSIS — M25562 Pain in left knee: Secondary | ICD-10-CM

## 2020-04-01 ENCOUNTER — Other Ambulatory Visit: Payer: Self-pay

## 2020-04-01 ENCOUNTER — Emergency Department: Payer: PRIVATE HEALTH INSURANCE

## 2020-04-01 ENCOUNTER — Emergency Department
Admission: EM | Admit: 2020-04-01 | Discharge: 2020-04-01 | Disposition: A | Discharge status: Home / Self Care | Payer: PRIVATE HEALTH INSURANCE | Attending: Emergency Medicine | Admitting: Emergency Medicine

## 2020-04-01 ENCOUNTER — Encounter: Payer: Self-pay | Admitting: Intensive Care

## 2020-04-01 DIAGNOSIS — Z87891 Personal history of nicotine dependence: Secondary | ICD-10-CM | POA: Diagnosis not present

## 2020-04-01 DIAGNOSIS — Z79899 Other long term (current) drug therapy: Secondary | ICD-10-CM | POA: Diagnosis not present

## 2020-04-01 DIAGNOSIS — J029 Acute pharyngitis, unspecified: Secondary | ICD-10-CM | POA: Diagnosis present

## 2020-04-01 DIAGNOSIS — Z20822 Contact with and (suspected) exposure to covid-19: Secondary | ICD-10-CM | POA: Diagnosis not present

## 2020-04-01 DIAGNOSIS — B349 Viral infection, unspecified: Secondary | ICD-10-CM

## 2020-04-01 DIAGNOSIS — R197 Diarrhea, unspecified: Secondary | ICD-10-CM | POA: Diagnosis not present

## 2020-04-01 DIAGNOSIS — R002 Palpitations: Secondary | ICD-10-CM | POA: Insufficient documentation

## 2020-04-01 DIAGNOSIS — I10 Essential (primary) hypertension: Secondary | ICD-10-CM | POA: Diagnosis not present

## 2020-04-01 LAB — CBC
HCT: 35.2 % — ABNORMAL LOW (ref 36.0–46.0)
Hemoglobin: 11 g/dL — ABNORMAL LOW (ref 12.0–15.0)
MCH: 23 pg — ABNORMAL LOW (ref 26.0–34.0)
MCHC: 31.3 g/dL (ref 30.0–36.0)
MCV: 73.6 fL — ABNORMAL LOW (ref 80.0–100.0)
Platelets: 226 10*3/uL (ref 150–400)
RBC: 4.78 MIL/uL (ref 3.87–5.11)
RDW: 16.4 % — ABNORMAL HIGH (ref 11.5–15.5)
WBC: 5.2 10*3/uL (ref 4.0–10.5)
nRBC: 0 % (ref 0.0–0.2)

## 2020-04-01 LAB — BASIC METABOLIC PANEL
Anion gap: 9 (ref 5–15)
BUN: 6 mg/dL (ref 6–20)
CO2: 26 mmol/L (ref 22–32)
Calcium: 9.9 mg/dL (ref 8.9–10.3)
Chloride: 102 mmol/L (ref 98–111)
Creatinine, Ser: 0.65 mg/dL (ref 0.44–1.00)
GFR, Estimated: >60 mL/min (ref >60)
Glucose, Bld: 104 mg/dL — ABNORMAL HIGH (ref 70–99)
Potassium: 3.9 mmol/L (ref 3.5–5.1)
Sodium: 137 mmol/L (ref 135–145)

## 2020-04-01 LAB — TROPONIN I (HIGH SENSITIVITY): Troponin I (High Sensitivity): <2 ng/L (ref <18)

## 2020-04-01 MED ORDER — PSEUDOEPH-BROMPHEN-DM 30-2-10 MG/5ML PO SYRP: 5.0000 mL | ORAL_SOLUTION | Freq: Four times a day (QID) | ORAL | 0 refills | Status: DC | PRN

## 2020-04-01 MED ORDER — AMOXICILLIN-POT CLAVULANATE 250-62.5 MG/5ML PO SUSR: 875.0000 mg | Freq: Two times a day (BID) | ORAL | 0 refills | Status: AC

## 2020-04-01 NOTE — Discharge Instructions (Signed)
Your COVID test is pending. Take antibiotics as prescribed. Alternate Tylenol and ibuprofen as needed for any fevers chills or body aches. Make sure you're drinking lots of fluids. If COVID test is positive you will need to quarantine for 10 days.

## 2020-04-01 NOTE — ED Triage Notes (Addendum)
Patient c/o sore throat, diarrhea, central chest pressure, and tachycardia. Sore throat started Tuesday morning and patient reports having first dose of moderna Tuesday. Reports having white patches on back of throat and hx of strep. Wednesday all of her other symptoms started. Reports recent outbreak of covid at her job.

## 2020-04-01 NOTE — ED Provider Notes (Signed)
[facility] EMERGENCY DEPARTMENT Provider Note   CSN: [id number] Arrival date & time: 04/01/20  1409     History Chief Complaint  Patient presents with  . Sore Throat  . Tachycardia  . Diarrhea    Shirley Stewart is a 40 y.o. female presents to the emergency department for evaluation of fever chills cough sore throat diarrhea chest pain. She reports exudates on her tonsils, has had significant strep in the past and routinely gets this yearly. No shortness of breath or vomiting. Symptoms began Monday. 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 She was vaccinated with Moderna 4 days ago. Patient describes sensation of her heart racing. She is currently without any chest pain or shortness of breath. And her heart is no longer racing. She denies taking any medications for her symptoms. She has had subjective fevers.  HPI     Past Medical History:  Diagnosis Date  . GERD (gastroesophageal reflux disease)   . Hypertension     There are no problems to display for this patient.   Past Surgical History:  Procedure Laterality Date  . TUBAL LIGATION       OB History    Gravida  8   Para      Term      Preterm      AB  2   Living  4     SAB  2   IAB      Ectopic      Multiple      Live Births              History reviewed. No pertinent family history.  Social History   Tobacco Use  . Smoking status: Former Smoker    Packs/day: 1.00    Types: Cigarettes    Quit date: 07/28/2015    Years since quitting: 4.6  . Smokeless tobacco: Never Used  Substance Use Topics  . Alcohol use: Yes    Comment: occ  . Drug use: No    Home Medications Prior to Admission medications   Medication Sig Start Date End Date Taking? Authorizing Provider  amoxicillin-clavulanate (AUGMENTIN) 250-62.5 MG/5ML suspension Take 17.5 mLs (875 mg total) by mouth 2 (two) times daily for 10 days. 04/01/20 04/11/20 Yes  Evon Slack, PA-C  brompheniramine-pseudoephedrine-DM 30-2-10 MG/5ML syrup Take 5 mLs by mouth 4 (four) times daily as needed. 04/01/20  Yes Evon Slack, PA-C  alum & mag hydroxide-simeth (MAALOX MAX) 400-400-40 MG/5ML suspension Take 10 mLs by mouth every 4 (four) hours as needed for indigestion. Mix with lidocaine and swallow 12/03/17   Cuthriell, Delorise Royals, PA-C  aspirin EC 325 MG tablet Take 650 mg by mouth every 6 (six) hours as needed for mild pain.    [provider]  aspirin-sod bicarb-citric acid (ALKA-SELTZER) 325 MG TBEF tablet Take 325 mg by mouth every 6 (six) hours as needed (indigestion).    [provider]  cyclobenzaprine (FLEXERIL) 10 MG tablet Take 1 tablet (10 mg total) by mouth 2 (two) times daily as needed for muscle spasms. 02/13/18   Donnetta Hutching, MD  D3-50 50000 units capsule Take 50,000 Units by mouth once a week. 08/26/17   [provider]  diclofenac (VOLTAREN) 50 MG EC tablet Take 1 tablet (50 mg total) by mouth 2 (two) times daily. Prn pain 02/13/18   Donnetta Hutching, MD  doxycycline (VIBRAMYCIN) 100 MG capsule Take 1 capsule (  100 mg total) by mouth 2 (two) times daily. 07/18/18   Emily Filbert, MD  hydrochlorothiazide (HYDRODIURIL) 12.5 MG tablet Take 2 tablets (25 mg total) by mouth daily. 04/24/17   Merrily Brittle, MD  ibuprofen (ADVIL) 800 MG tablet Take 1 tablet (800 mg total) by mouth every 8 (eight) hours as needed. 07/18/18   Emily Filbert, MD  lidocaine (XYLOCAINE) 2 % solution Use as directed 10 mLs in the mouth or throat every 4 (four) hours as needed for mouth pain. Add with maalox and swallow 12/03/17   Cuthriell, Delorise Royals, PA-C  metroNIDAZOLE (FLAGYL) 500 MG tablet Take 1 tablet (500 mg total) by mouth 2 (two) times daily. 07/18/18   Emily Filbert, MD    Allergies    Patient has no known allergies.  Review of Systems   Review of Systems  Constitutional: Positive for chills and fever.  HENT: Positive for  rhinorrhea and sore throat. Negative for trouble swallowing.   Respiratory: Positive for cough. Negative for shortness of breath.   Cardiovascular: Positive for palpitations.  Gastrointestinal: Positive for diarrhea. Negative for abdominal pain, nausea and vomiting.  Genitourinary: Negative for dysuria.  Skin: Negative for rash and wound.    Physical Exam Updated Vital Signs BP 139/77 (BP Location: Left Arm)   Pulse (!) 106   Temp 98.7 F (37.1 C)   Resp 20   Ht 5' (1.524 m)   Wt 83.5 kg   LMP 03/27/2020 (Within Days) Comment: tubal ligation  SpO2 100%   BMI 35.94 kg/m   Physical Exam Constitutional:      General: She is not in acute distress.    Appearance: She is well-developed and well-nourished.  HENT:     Head: Normocephalic and atraumatic.     Jaw: No trismus.     Right Ear: Hearing, tympanic membrane, ear canal and external ear normal.     Left Ear: Hearing, tympanic membrane, ear canal and external ear normal.     Nose: Rhinorrhea present.     Mouth/Throat:     Mouth: Mucous membranes are normal. Mucous membranes are moist.     Pharynx: Uvula midline. Oropharyngeal exudate and posterior oropharyngeal erythema present. No pharyngeal swelling, posterior oropharyngeal edema or uvula swelling.     Tonsils: Tonsillar exudate present. No tonsillar abscesses.  Eyes:     General:        Right eye: No discharge.        Left eye: No discharge.     Conjunctiva/sclera: Conjunctivae normal.  Cardiovascular:     Rate and Rhythm: Normal rate and regular rhythm.     Heart sounds: Normal heart sounds.  Pulmonary:     Effort: Pulmonary effort is normal. No respiratory distress.     Breath sounds: Normal breath sounds. No stridor. No wheezing or rales.  Abdominal:     General: There is no distension.     Palpations: Abdomen is soft.     Tenderness: There is no abdominal tenderness.  Musculoskeletal:        General: No deformity. Normal range of motion.     Cervical back:  Normal range of motion.  Lymphadenopathy:     Cervical: Cervical adenopathy present.  Skin:    General: Skin is warm and dry.  Neurological:     Mental Status: She is alert and oriented to person, place, and time.     Deep Tendon Reflexes: Reflexes are normal and symmetric.  Psychiatric:  Mood and Affect: Mood and affect normal.        Behavior: Behavior normal.        Thought Content: Thought content normal.     ED Results / Procedures / Treatments   Labs (all labs ordered are listed, but only abnormal results are displayed) Labs Reviewed  BASIC METABOLIC PANEL - Abnormal; Notable for the following components:      Result Value   Glucose, Bld 104 (*)    All other components within normal limits  CBC - Abnormal; Notable for the following components:   Hemoglobin 11.0 (*)    HCT 35.2 (*)    MCV 73.6 (*)    MCH 23.0 (*)    RDW 16.4 (*)    All other components within normal limits  SARS CORONAVIRUS 2 (TAT 6-24 HRS)  TROPONIN I (HIGH SENSITIVITY)  TROPONIN I (HIGH SENSITIVITY)    EKG None  Radiology DG Chest 2 View  Result Date: 04/01/2020 CLINICAL DATA:  Tachycardia. EXAM: CHEST - 2 VIEW COMPARISON:  February 13, 2018 FINDINGS: The heart size and mediastinal contours are within normal limits. Both lungs are clear. The visualized skeletal structures are unremarkable. IMPRESSION: No active cardiopulmonary disease. Electronically Signed   By: Gerome Sam III M.D   On: 04/01/2020 15:49    Procedures Procedures (including critical care time)  Medications Ordered in ED Medications - No data to display  ED Course  I have reviewed the triage vital signs and the nursing notes.  Pertinent labs & imaging results that were available during my care of the patient were reviewed by me and considered in my medical decision making (see chart for details).    MDM Rules/Calculators/A&P                          40 year old female with COVID-like symptoms. Symptoms been  present for 4 to 5 days. She describes palpitations that have improved. Mostly complaining today of sore throat with history of exudates. We will perform COVID test and prescribe Augmentin for strep pharyngitis as she has pharyngeal erythema and exudates. COVID test is pending. She understands protocols to follow pending test results. She understands signs symptoms return to the ER for. Patient with EKG with normal sinus rhythm with normal CBC, BMP and negative troponin. Chest x-ray negative. She is given a note for work. She understands signs symptoms return to the ER for. Final Clinical Impression(s) / ED Diagnoses Final diagnoses:  Pharyngitis, unspecified etiology  Viral illness    Rx / DC Orders ED Discharge Orders         Ordered    amoxicillin-clavulanate (AUGMENTIN) 250-62.5 MG/5ML suspension  2 times daily        04/01/20 1705    brompheniramine-pseudoephedrine-DM 30-2-10 MG/5ML syrup  4 times daily PRN        04/01/20 1705           Ronnette Juniper 04/01/20 1712    Jene Every, MD 04/02/20 1626

## 2020-04-02 LAB — SARS CORONAVIRUS 2 (TAT 6-24 HRS): SARS Coronavirus 2: NEGATIVE

## 2021-02-13 DIAGNOSIS — J309 Allergic rhinitis, unspecified: Secondary | ICD-10-CM | POA: Diagnosis not present

## 2021-02-13 DIAGNOSIS — B86 Scabies: Secondary | ICD-10-CM | POA: Diagnosis not present

## 2021-03-25 ENCOUNTER — Emergency Department
Admission: EM | Admit: 2021-03-25 | Discharge: 2021-03-25 | Disposition: A | Discharge status: Home / Self Care | Payer: Medicaid Other | Attending: Emergency Medicine | Admitting: Emergency Medicine

## 2021-03-25 ENCOUNTER — Emergency Department: Payer: Medicaid Other

## 2021-03-25 ENCOUNTER — Other Ambulatory Visit: Payer: Self-pay

## 2021-03-25 DIAGNOSIS — Z5321 Procedure and treatment not carried out due to patient leaving prior to being seen by health care provider: Secondary | ICD-10-CM | POA: Insufficient documentation

## 2021-03-25 DIAGNOSIS — U071 COVID-19: Secondary | ICD-10-CM | POA: Insufficient documentation

## 2021-03-25 DIAGNOSIS — R0789 Other chest pain: Secondary | ICD-10-CM | POA: Diagnosis present

## 2021-03-25 LAB — CBC
HCT: 32.2 % — ABNORMAL LOW (ref 36.0–46.0)
Hemoglobin: 9.9 g/dL — ABNORMAL LOW (ref 12.0–15.0)
MCH: 22.4 pg — ABNORMAL LOW (ref 26.0–34.0)
MCHC: 30.7 g/dL (ref 30.0–36.0)
MCV: 72.9 fL — ABNORMAL LOW (ref 80.0–100.0)
Platelets: 200 10*3/uL (ref 150–400)
RBC: 4.42 MIL/uL (ref 3.87–5.11)
RDW: 15.9 % — ABNORMAL HIGH (ref 11.5–15.5)
WBC: 6 10*3/uL (ref 4.0–10.5)
nRBC: 0 % (ref 0.0–0.2)

## 2021-03-25 LAB — BASIC METABOLIC PANEL
Anion gap: 7 (ref 5–15)
BUN: 7 mg/dL (ref 6–20)
CO2: 25 mmol/L (ref 22–32)
Calcium: 9.3 mg/dL (ref 8.9–10.3)
Chloride: 103 mmol/L (ref 98–111)
Creatinine, Ser: 0.8 mg/dL (ref 0.44–1.00)
GFR, Estimated: >60 mL/min (ref >60)
Glucose, Bld: 135 mg/dL — ABNORMAL HIGH (ref 70–99)
Potassium: 2.9 mmol/L — ABNORMAL LOW (ref 3.5–5.1)
Sodium: 135 mmol/L (ref 135–145)

## 2021-03-25 LAB — RESP PANEL BY RT-PCR (FLU A&B, COVID) ARPGX2
Influenza A by PCR: NEGATIVE
Influenza B by PCR: NEGATIVE
SARS Coronavirus 2 by RT PCR: POSITIVE — AB

## 2021-03-25 LAB — TROPONIN I (HIGH SENSITIVITY): Troponin I (High Sensitivity): <2 ng/L (ref <18)

## 2021-03-25 NOTE — ED Triage Notes (Signed)
Pt presents to ER c/o chest heaviness, weakness, and fever.  Pt states she tested positive for COVID with a home test yesterday.  Pt A&O x4 at this time with NAD noted.

## 2021-04-10 DIAGNOSIS — B86 Scabies: Secondary | ICD-10-CM | POA: Diagnosis not present

## 2021-05-18 ENCOUNTER — Emergency Department
Admission: EM | Admit: 2021-05-18 | Discharge: 2021-05-18 | Disposition: A | Discharge status: Home / Self Care | Payer: Medicaid Other | Attending: Emergency Medicine | Admitting: Emergency Medicine

## 2021-05-18 ENCOUNTER — Encounter: Payer: Self-pay | Admitting: Emergency Medicine

## 2021-05-18 ENCOUNTER — Other Ambulatory Visit: Payer: Self-pay

## 2021-05-18 DIAGNOSIS — I10 Essential (primary) hypertension: Secondary | ICD-10-CM | POA: Diagnosis not present

## 2021-05-18 DIAGNOSIS — L509 Urticaria, unspecified: Secondary | ICD-10-CM | POA: Diagnosis not present

## 2021-05-18 DIAGNOSIS — R21 Rash and other nonspecific skin eruption: Secondary | ICD-10-CM

## 2021-05-18 LAB — CBC WITH DIFFERENTIAL/PLATELET
Abs Immature Granulocytes: 0.01 10*3/uL (ref 0.00–0.07)
Basophils Absolute: 0.1 10*3/uL (ref 0.0–0.1)
Basophils Relative: 1 %
Eosinophils Absolute: 0.1 10*3/uL (ref 0.0–0.5)
Eosinophils Relative: 3 %
HCT: 32.5 % — ABNORMAL LOW (ref 36.0–46.0)
Hemoglobin: 9.8 g/dL — ABNORMAL LOW (ref 12.0–15.0)
Immature Granulocytes: 0 %
Lymphocytes Relative: 37 %
Lymphs Abs: 1.9 10*3/uL (ref 0.7–4.0)
MCH: 21.9 pg — ABNORMAL LOW (ref 26.0–34.0)
MCHC: 30.2 g/dL (ref 30.0–36.0)
MCV: 72.5 fL — ABNORMAL LOW (ref 80.0–100.0)
Monocytes Absolute: 0.3 10*3/uL (ref 0.1–1.0)
Monocytes Relative: 6 %
Neutro Abs: 2.7 10*3/uL (ref 1.7–7.7)
Neutrophils Relative %: 53 %
Platelets: 230 10*3/uL (ref 150–400)
RBC: 4.48 MIL/uL (ref 3.87–5.11)
RDW: 16.7 % — ABNORMAL HIGH (ref 11.5–15.5)
WBC: 5.1 10*3/uL (ref 4.0–10.5)
nRBC: 0 % (ref 0.0–0.2)

## 2021-05-18 LAB — BASIC METABOLIC PANEL
Anion gap: 9 (ref 5–15)
BUN: 8 mg/dL (ref 6–20)
CO2: 26 mmol/L (ref 22–32)
Calcium: 9 mg/dL (ref 8.9–10.3)
Chloride: 104 mmol/L (ref 98–111)
Creatinine, Ser: 0.84 mg/dL (ref 0.44–1.00)
GFR, Estimated: >60 mL/min (ref >60)
Glucose, Bld: 99 mg/dL (ref 70–99)
Potassium: 3.6 mmol/L (ref 3.5–5.1)
Sodium: 139 mmol/L (ref 135–145)

## 2021-05-18 LAB — HEPATIC FUNCTION PANEL
ALT: 14 U/L (ref 0–44)
AST: 18 U/L (ref 15–41)
Albumin: 4.1 g/dL (ref 3.5–5.0)
Alkaline Phosphatase: 54 U/L (ref 38–126)
Bilirubin, Direct: <0.1 mg/dL (ref 0.0–0.2)
Total Bilirubin: 0.5 mg/dL (ref 0.3–1.2)
Total Protein: 7.6 g/dL (ref 6.5–8.1)

## 2021-05-18 MED ORDER — FAMOTIDINE 20 MG PO TABS
20.0000 mg | ORAL_TABLET | Freq: Once | ORAL | Status: AC
  Administered 2021-05-18: 20 mg via ORAL
  Filled 2021-05-18: qty 1

## 2021-05-18 MED ORDER — FAMOTIDINE 20 MG PO TABS: 20.0000 mg | ORAL_TABLET | Freq: Two times a day (BID) | ORAL | 0 refills | Status: AC

## 2021-05-18 MED ORDER — CYPROHEPTADINE HCL 4 MG PO TABS
4.0000 mg | ORAL_TABLET | Freq: Three times a day (TID) | ORAL | 0 refills | Status: DC | PRN
Start: 2021-05-18 — End: 2021-11-04

## 2021-05-18 MED ORDER — IVERMECTIN 3 MG PO TABS: 200.0000 ug/kg | ORAL_TABLET | Freq: Once | ORAL | 0 refills | Status: AC

## 2021-05-18 MED ORDER — DEXAMETHASONE SODIUM PHOSPHATE 10 MG/ML IJ SOLN
10.0000 mg | Freq: Once | INTRAMUSCULAR | Status: AC
  Administered 2021-05-18: 10 mg via INTRAMUSCULAR
  Filled 2021-05-18: qty 1

## 2021-05-18 MED ORDER — PREDNISONE 10 MG (21) PO TBPK: ORAL_TABLET | ORAL | 0 refills | Status: DC

## 2021-05-18 MED ORDER — HYDROCHLOROTHIAZIDE 12.5 MG PO TABS: 25.0000 mg | ORAL_TABLET | Freq: Every day | ORAL | 1 refills | Status: DC

## 2021-05-18 NOTE — Discharge Instructions (Addendum)
Your labs are within normal limits. Take the prescription meds as directed as directed. Follow-up with your provider or Dermatology as needed.

## 2021-05-18 NOTE — ED Provider Notes (Signed)
[facility] Emergency Department Provider Note     Event Date/Time   First MD Initiated Contact with Patient 05/18/21 1048     (approximate)   History   Rash   HPI  Shirley Stewart is a 41 y.o. female with past medical history of hypertension and GERD, presents to the ED for ongoing generalized itching and rash to the left flank.  Patient reports that symptoms the last month.  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  She does report that the residents in the facility have all been treated for scabies with permethrin.  Patient herself is also had a course of permethrin, and 2 separate oral doses of ivermectin.  She will report short-term relief of her symptoms after dose of ivermectin.  She presents today with some concerns for recurrent rash to the left thoracic region.  She denies any other areas of concern.  She denies any exposure in the household contacts.     Physical Exam   Triage Vital Signs: ED Triage Vitals  Enc Vitals Group     BP 05/18/21 1020 133/88     Pulse Rate 05/18/21 1020 81     Resp 05/18/21 1020 16     Temp 05/18/21 1020 98.3 F (36.8 C)     Temp Source 05/18/21 1020 Oral     SpO2 05/18/21 1020 99 %     Weight 05/18/21 1016 179 lb 14.3 oz (81.6 kg)     Height 05/18/21 1016 5' (1.524 m)     Head Circumference --      Peak Flow --      Pain Score 05/18/21 1016 0     Pain Loc --      Pain Edu? --      Excl. in [place]? --     Most recent vital signs: Vitals:   05/18/21 1020  BP: 133/88  Pulse: 81  Resp: 16  Temp: 98.3 F (36.8 C)  SpO2: 99%    General Awake, no distress.  CV:  Good peripheral perfusion.  RESP:  Normal effort.  ABD:  No distention.  SKIN:  Patient to the ED for evaluation of persistent intermittent rash.  Patient presents with some dry scaly skin to the trunk and extremities.  The area of concern to the left thoracic region is without any obvious papules pustules, vesicles, or linear skin  eruptions. Scattered excoriations noted. Otherwise skin is without erythema, edema, or warmth.    ED Results / Procedures / Treatments   Labs (all labs ordered are listed, but only abnormal results are displayed) Labs Reviewed  CBC WITH DIFFERENTIAL/PLATELET - Abnormal; Notable for the following components:      Result Value   Hemoglobin 9.8 (*)    HCT 32.5 (*)    MCV 72.5 (*)    MCH 21.9 (*)    RDW 16.7 (*)    All other components within normal limits  BASIC METABOLIC PANEL  HEPATIC FUNCTION PANEL     EKG   RADIOLOGY   No results found.   PROCEDURES:  Critical Care performed: No  Procedures   MEDICATIONS ORDERED IN ED: Medications  dexamethasone (DECADRON) injection 10 mg (10 mg Intramuscular Given 05/18/21 1200)  famotidine (PEPCID) tablet 20 mg (20 mg Oral Given 05/18/21 1201)     IMPRESSION / MDM / ASSESSMENT AND PLAN / ED COURSE  I reviewed the triage vital signs and the nursing notes.  Differential diagnosis includes, but is not limited to, scabies, atopic dermatitis, urticaria, contact dermatitis, xeroderma, electrolyte abnormality  Patient to the ED for evaluation of intermittent persisted pruritic skin rash patient divided for complaints in the ED, found to have no concerning lesions to the skin.  Patient with xeroderma, excoriations, and some local skin irritation after using local cream on her skin.  No vesicular lesions, linear burrows, or pustules are appreciated.  Patient likely is experiencing a local contact dermatitis or allergic reaction.  She will be treated with antihistamines and given instruction on OTC emollients.  She is given a prescription for ivermectin that she may use if and only if her symptoms do not improved after the antihistamine and steroid course.   Patient is to follow up with her primary provider or dermatology as needed or otherwise directed. Patient is given ED precautions to return to the ED for any  worsening or new symptoms.    FINAL CLINICAL IMPRESSION(S) / ED DIAGNOSES   Final diagnoses:  Rash and nonspecific skin eruption  Urticaria     Rx / DC Orders   ED Discharge Orders          Ordered    hydrochlorothiazide (HYDRODIURIL) 12.5 MG tablet  Daily        05/18/21 1141    famotidine (PEPCID) 20 MG tablet  2 times daily        05/18/21 1142    predniSONE (STERAPRED UNI-PAK 21 TAB) 10 MG (21) TBPK tablet        05/18/21 1142    cyproheptadine (PERIACTIN) 4 MG tablet  3 times daily PRN        05/18/21 1142    ivermectin (STROMECTOL) 3 MG TABS tablet   Once        05/18/21 1213             Note:  This document was prepared using Dragon voice recognition software and may include unintentional dictation errors.    Melvenia Needles, PA-C 05/18/21 1638    Carrie Mew, MD 05/20/21 [phone]

## 2021-05-18 NOTE — ED Triage Notes (Signed)
Pt comes into the ED via POV c/o rash on the left flank that has been ongoing x 1 month.  Pt in NAD at this time with even and unlabored respirations.

## 2021-05-21 NOTE — Telephone Encounter (Signed)
error 

## 2021-08-01 ENCOUNTER — Encounter: Payer: Self-pay | Admitting: Emergency Medicine

## 2021-08-01 ENCOUNTER — Other Ambulatory Visit: Payer: Self-pay

## 2021-08-01 ENCOUNTER — Emergency Department
Admission: EM | Admit: 2021-08-01 | Discharge: 2021-08-01 | Disposition: A | Discharge status: Home / Self Care | Payer: Medicaid Other | Attending: Emergency Medicine | Admitting: Emergency Medicine

## 2021-08-01 DIAGNOSIS — R21 Rash and other nonspecific skin eruption: Secondary | ICD-10-CM | POA: Diagnosis present

## 2021-08-01 DIAGNOSIS — B86 Scabies: Secondary | ICD-10-CM | POA: Diagnosis not present

## 2021-08-01 DIAGNOSIS — L259 Unspecified contact dermatitis, unspecified cause: Secondary | ICD-10-CM | POA: Diagnosis not present

## 2021-08-01 MED ORDER — PERMETHRIN 5 % EX CREA: 1.0000 "application " | TOPICAL_CREAM | Freq: Once | CUTANEOUS | 1 refills | Status: AC

## 2021-08-01 MED ORDER — IVERMECTIN 3 MG PO TABS: 200.0000 ug/kg | ORAL_TABLET | Freq: Once | ORAL | 0 refills | Status: AC

## 2021-08-01 MED ORDER — HYDROXYZINE PAMOATE 100 MG PO CAPS: 100.0000 mg | ORAL_CAPSULE | Freq: Three times a day (TID) | ORAL | 0 refills | Status: DC | PRN

## 2021-08-01 MED ORDER — CEPHALEXIN 500 MG PO CAPS: 1000.0000 mg | ORAL_CAPSULE | Freq: Two times a day (BID) | ORAL | 0 refills | Status: DC

## 2021-08-01 MED ORDER — TRIAMCINOLONE ACETONIDE 0.1 % EX CREA: 1.0000 "application " | TOPICAL_CREAM | Freq: Four times a day (QID) | CUTANEOUS | 2 refills | Status: DC

## 2021-08-01 NOTE — ED Triage Notes (Addendum)
C/O recurrent rash to arms x 1 month ? ?AAOx3.  Skin warm and dry. NAD ?

## 2021-08-01 NOTE — ED Provider Notes (Signed)
? ?[facility] ?Provider Note ? ?Patient Contact: 5:16 PM (approximate) ? ? ?History  ? ?Rash ? ? ?HPI ? ?Shirley Stewart is a 41 y.o. female who presents the emergency department complaining of ongoing scabies.  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  She was treated with topical permethrin initially but it had spread past her hands and arms.  She is currently having linear burrowing lines to her arms, hands, torso and legs.  Patient states that she did receive a dose of oral ivermectin a couple of weeks ago and it calmed it down almost to the point of going away but she states that her work still had positive individuals and she believes she has ongoing contact and is still experiencing symptoms.  No other complaints at this time. ?  ? ? ?Physical Exam  ? ?Triage Vital Signs: ?ED Triage Vitals  ?Enc Vitals Group  ?   BP 08/01/21 1611 136/85  ?   Pulse Rate 08/01/21 1611 (!) 107  ?   Resp 08/01/21 1611 18  ?   Temp 08/01/21 1611 98.3 ?F (36.8 ?C)  ?   Temp Source 08/01/21 1611 Oral  ?   SpO2 08/01/21 1611 100 %  ?   Weight 08/01/21 1553 179 lb 14.3 oz (81.6 kg)  ?   Height 08/01/21 1553 5' (1.524 m)  ?   Head Circumference --   ?   Peak Flow --   ?   Pain Score 08/01/21 1553 0  ?   Pain Loc --   ?   Pain Edu? --   ?   Excl. in [place]? --   ? ? ?Most recent vital signs: ?Vitals:  ? 08/01/21 1611  ?BP: 136/85  ?Pulse: (!) 107  ?Resp: 18  ?Temp: 98.3 ?F (36.8 ?C)  ?SpO2: 100%  ? ? ? ?General: Alert and in no acute distress.   ?Cardiovascular:  Good peripheral perfusion ?Respiratory: Normal respiratory effort without tachypnea or retractions. Lungs CTAB.  ?Musculoskeletal: Full range of motion to all extremities.  ?Neurologic:  No gross focal neurologic deficits are appreciated.  ?Skin:   Multiple areas of skin changes identified.  Patient has 3-4 areas that the patient has scratched and caused excoriation to the point of developing  surrounding cellulitis.  There is no abscess.  Patient has multiple erythematous lesions consistent with bite.  Patient also has several areas to her arms, left shoulder, torso and legs that are linear in nature consistent with scabies. ?Other: ? ? ?ED Results / Procedures / Treatments  ? ?Labs ?(all labs ordered are listed, but only abnormal results are displayed) ?Labs Reviewed - No data to display ? ? ?EKG ? ? ? ? ?RADIOLOGY ? ? ? ?No results found. ? ?PROCEDURES: ? ?Critical Care performed:  ? ?Procedures ? ? ?MEDICATIONS ORDERED IN ED: ?Medications - No data to display ? ? ?IMPRESSION / MDM / ASSESSMENT AND PLAN / ED COURSE  ?I reviewed the triage vital signs and the nursing notes. ?             ?               ? ?Differential diagnosis includes, but is not limited to, Contact dermatitis, scabies, bedbugs, cellulitis ? ? ?Patient's diagnosis is consistent with scabies.  Patient presents emergency department after a positive exposure to scabies.  Patient has ongoing symptoms despite being treated once with permethrin and wants with  ivermectin.  Patient states that each treatment has almost fully alleviated symptoms but she had ongoing exposure where she worked.  Patient has since quit her job, but needs treatment.  Findings are consistent with scabies as well as some areas of cellulitis from scratching.  Patient will be on an antibiotic, topical Kenalog cream, as well as a dose of permethrin and ivermectin..  Follow-up primary care or dermatology as needed.  Patient is given ED precautions to return to the ED for any worsening or new symptoms. ? ? ? ?  ? ? ?FINAL CLINICAL IMPRESSION(S) / ED DIAGNOSES  ? ?Final diagnoses:  ?Scabies  ? ? ? ?Rx / DC Orders  ? ?ED Discharge Orders   ? ?      Ordered  ?  ivermectin (STROMECTOL) 3 MG TABS tablet   Once       ? 08/01/21 1732  ?  permethrin (ELIMITE) 5 % cream   Once       ? 08/01/21 1732  ?  triamcinolone cream (KENALOG) 0.1 %  4 times daily       ? 08/01/21 1732  ?   cephALEXin (KEFLEX) 500 MG capsule  2 times daily       ? 08/01/21 1732  ?  hydrOXYzine (VISTARIL) 100 MG capsule  3 times daily PRN       ? 08/01/21 1732  ? ?  ?  ? ?  ? ? ? ?Note:  This document was prepared using Dragon voice recognition software and may include unintentional dictation errors. ?  ?Racheal Patches, PA-C ?08/01/21 1734 ? ?  ?Georga Hacking, MD ?08/01/21 1932 ? ?

## 2021-08-01 NOTE — ED Notes (Signed)
Pt A&O, discharge instructions given, pt ambulating with steady gait. ?

## 2021-08-01 NOTE — ED Notes (Signed)
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

## 2021-08-02 ENCOUNTER — Telehealth: Payer: Self-pay

## 2021-08-02 NOTE — Telephone Encounter (Signed)
Transition Care Management Unsuccessful Follow-up Telephone Call ? ?Date of discharge and from where:  08/01/2021-[facility] ? ?Attempts:  1st Attempt ? ?Reason for unsuccessful TCM follow-up call:  Left voice message ? ?  ?

## 2021-08-03 NOTE — Telephone Encounter (Signed)
Transition Care Management Unsuccessful Follow-up Telephone Call ? ?Date of discharge and from where:  08/01/2021-[facility] ? ?Attempts:  2nd Attempt ? ?Reason for unsuccessful TCM follow-up call:  Left voice message ? ?  ?

## 2021-08-07 NOTE — Telephone Encounter (Signed)
Transition Care Management Unsuccessful Follow-up Telephone Call ? ?Date of discharge and from where:   08/01/2021-[facility] ? ?Attempts:  3rd Attempt ? ?Reason for unsuccessful TCM follow-up call:  Unable to reach patient ? ? ? ?

## 2021-10-03 DIAGNOSIS — R21 Rash and other nonspecific skin eruption: Secondary | ICD-10-CM | POA: Diagnosis not present

## 2021-11-04 ENCOUNTER — Emergency Department
Admission: EM | Admit: 2021-11-04 | Discharge: 2021-11-04 | Disposition: A | Discharge status: Home / Self Care | Payer: Medicaid Other | Attending: Student in an Organized Health Care Education/Training Program | Admitting: Student in an Organized Health Care Education/Training Program

## 2021-11-04 ENCOUNTER — Other Ambulatory Visit: Payer: Self-pay

## 2021-11-04 DIAGNOSIS — H9201 Otalgia, right ear: Secondary | ICD-10-CM | POA: Diagnosis present

## 2021-11-04 DIAGNOSIS — L299 Pruritus, unspecified: Secondary | ICD-10-CM

## 2021-11-04 DIAGNOSIS — L308 Other specified dermatitis: Secondary | ICD-10-CM | POA: Diagnosis not present

## 2021-11-04 DIAGNOSIS — I10 Essential (primary) hypertension: Secondary | ICD-10-CM | POA: Diagnosis not present

## 2021-11-04 DIAGNOSIS — H66001 Acute suppurative otitis media without spontaneous rupture of ear drum, right ear: Secondary | ICD-10-CM

## 2021-11-04 MED ORDER — PERMETHRIN 5 % EX CREA: TOPICAL_CREAM | CUTANEOUS | 0 refills | Status: AC

## 2021-11-04 MED ORDER — NEOMYCIN-POLYMYXIN-HC 3.5-10000-1 OT SOLN: 3.0000 [drp] | Freq: Four times a day (QID) | OTIC | 0 refills | Status: AC

## 2021-11-04 MED ORDER — HYDROXYZINE PAMOATE 25 MG PO CAPS: 25.0000 mg | ORAL_CAPSULE | Freq: Three times a day (TID) | ORAL | 0 refills | Status: AC | PRN

## 2021-11-04 MED ORDER — AZITHROMYCIN 250 MG PO TABS: ORAL_TABLET | ORAL | 0 refills | Status: AC

## 2021-11-04 MED ORDER — HYDROCHLOROTHIAZIDE 25 MG PO TABS: 25.0000 mg | ORAL_TABLET | Freq: Every day | ORAL | 2 refills | Status: AC

## 2021-11-04 NOTE — ED Triage Notes (Signed)
Pt with c/o right ear pain since this past Tuesday. Pt denies drainage, fevers, or HA. Pt denies swimming.

## 2021-11-04 NOTE — Discharge Instructions (Addendum)
Take the antibiotic as directed and use the eyedrops as needed.  Continue with over-the-counter allergy medicine as prescribed.  Follow-up with your primary provider or [place] as needed.

## 2021-11-04 NOTE — ED Notes (Signed)
40 yof with a c/c of right sided ear pain since Tuesday.

## 2021-11-04 NOTE — ED Provider Notes (Signed)
[facility] Emergency Department Provider Note     Event Date/Time   First MD Initiated Contact with Patient 11/04/21 1754     (approximate)   History   Otalgia   HPI  Shirley Stewart is a 41 y.o. female with a history of hypertension and GERD, who presents to the ED with right ear pain and pressure since Tuesday.  She denies any spontaneous purulent drainage, dizziness, vertigo, or tinnitus.  She also denies any fevers or headache.  She reports decreased hearing on the right side as well as pressure and fullness.     Physical Exam   Triage Vital Signs: ED Triage Vitals  Enc Vitals Group     BP 11/04/21 1746 (!) 151/90     Pulse Rate 11/04/21 1746 98     Resp --      Temp 11/04/21 1746 98.6 F (37 C)     Temp src --      SpO2 11/04/21 1746 100 %     Weight 11/04/21 1747 180 lb (81.6 kg)     Height 11/04/21 1747 5' (1.524 m)     Head Circumference --      Peak Flow --      Pain Score 11/04/21 1747 9     Pain Loc --      Pain Edu? --      Excl. in GC? --     Most recent vital signs: Vitals:   11/04/21 1746  BP: (!) 151/90  Pulse: 98  Temp: 98.6 F (37 C)  SpO2: 100%    General Awake, no distress. NAD HEENT NCAT. PERRL. EOMI. No rhinorrhea. Mucous membranes are moist.  TMs intact bilaterally.  The right TM is injected, erythematous, and shows a mild purulent effusion.  Canals are patent bilaterally. CV:  Good peripheral perfusion.  RESP:  Normal effort. CTA ABD:  No distention.  SKIN:  No rash. Mild excoriations noted to the upper truck    ED Results / Procedures / Treatments   Labs (all labs ordered are listed, but only abnormal results are displayed) Labs Reviewed - No data to display   EKG   RADIOLOGY   No results found.   PROCEDURES:  Critical Care performed: No  Procedures   MEDICATIONS ORDERED IN ED: Medications - No data to display   IMPRESSION / MDM / ASSESSMENT AND PLAN / ED COURSE  I reviewed the  triage vital signs and the nursing notes.                              Differential diagnosis includes, but is not limited to, AOM, otitis externa, sinusitis, dental pain  Patient's presentation is most consistent with acute, uncomplicated illness.  Patient's diagnosis is consistent with left otitis media. Patient will be discharged home with prescriptions for azithromycin, Cortisporin, courtesy refill of her hydrochlorothiazide and permethrin patient is to follow up with primary provider or local urgent care as needed or otherwise directed. Patient is given ED precautions to return to the ED for any worsening or new symptoms.     FINAL CLINICAL IMPRESSION(S) / ED DIAGNOSES   Final diagnoses:  Non-recurrent acute suppurative otitis media of right ear without spontaneous rupture of tympanic membrane  Pruritic dermatitis     Rx / DC Orders   ED Discharge Orders          Ordered    azithromycin (ZITHROMAX Z-PAK) 250  MG tablet        11/04/21 1808    neomycin-polymyxin-hydrocortisone (CORTISPORIN) OTIC solution  4 times daily        11/04/21 1808    permethrin (ELIMITE) 5 % cream        11/04/21 1808    hydrOXYzine (VISTARIL) 25 MG capsule  3 times daily PRN        11/04/21 1808    hydrochlorothiazide (HYDRODIURIL) 25 MG tablet  Daily        11/04/21 1808             Note:  This document was prepared using Dragon voice recognition software and may include unintentional dictation errors.    Lissa Hoard, PA-C 11/04/21 2006    Willy Eddy, MD 11/04/21 2014

## 2022-11-28 DIAGNOSIS — Z1331 Encounter for screening for depression: Secondary | ICD-10-CM | POA: Diagnosis not present

## 2022-11-28 DIAGNOSIS — U071 COVID-19: Secondary | ICD-10-CM | POA: Diagnosis not present
